# Patient Record
Sex: Female | Born: 1980
Health system: Southern US, Community
[De-identification: ages and names within clinical notes are randomized; demographics above are authoritative.]

## PROBLEM LIST (undated history)

## (undated) DIAGNOSIS — G43909 Migraine, unspecified, not intractable, without status migrainosus: Secondary | ICD-10-CM

## (undated) DIAGNOSIS — R87619 Unspecified abnormal cytological findings in specimens from cervix uteri: Secondary | ICD-10-CM

## (undated) DIAGNOSIS — E559 Vitamin D deficiency, unspecified: Secondary | ICD-10-CM

## (undated) HISTORY — DX: Vitamin D deficiency, unspecified: E55.9

## (undated) HISTORY — DX: Unspecified abnormal cytological findings in specimens from cervix uteri: R87.619

## (undated) HISTORY — PX: OTHER SURGICAL HISTORY: SHX169

## (undated) HISTORY — DX: Migraine, unspecified, not intractable, without status migrainosus: G43.909

---

## 2013-11-17 ENCOUNTER — Emergency Department: Payer: Self-pay | Admitting: Emergency Medicine

## 2018-01-03 DIAGNOSIS — R87619 Unspecified abnormal cytological findings in specimens from cervix uteri: Secondary | ICD-10-CM

## 2018-01-03 HISTORY — DX: Unspecified abnormal cytological findings in specimens from cervix uteri: R87.619

## 2018-01-08 ENCOUNTER — Encounter: Payer: Self-pay | Admitting: Family Medicine

## 2018-01-08 ENCOUNTER — Other Ambulatory Visit (HOSPITAL_COMMUNITY)
Admission: RE | Admit: 2018-01-08 | Discharge: 2018-01-08 | Disposition: A | Discharge status: Home / Self Care | Payer: BLUE CROSS/BLUE SHIELD | Source: Ambulatory Visit | Attending: Family Medicine | Admitting: Family Medicine

## 2018-01-08 ENCOUNTER — Ambulatory Visit (INDEPENDENT_AMBULATORY_CARE_PROVIDER_SITE_OTHER): Payer: BLUE CROSS/BLUE SHIELD | Admitting: Family Medicine

## 2018-01-08 VITALS — BP 118/78 | HR 92 | Temp 98.1°F | Ht 63.0 in | Wt 184.8 lb

## 2018-01-08 DIAGNOSIS — Z6832 Body mass index (BMI) 32.0-32.9, adult: Secondary | ICD-10-CM

## 2018-01-08 DIAGNOSIS — Z124 Encounter for screening for malignant neoplasm of cervix: Secondary | ICD-10-CM | POA: Insufficient documentation

## 2018-01-08 DIAGNOSIS — G43909 Migraine, unspecified, not intractable, without status migrainosus: Secondary | ICD-10-CM

## 2018-01-08 DIAGNOSIS — Z0000 Encounter for general adult medical examination without abnormal findings: Secondary | ICD-10-CM | POA: Diagnosis not present

## 2018-01-08 DIAGNOSIS — Z23 Encounter for immunization: Secondary | ICD-10-CM | POA: Diagnosis not present

## 2018-01-08 MED ORDER — AMITRIPTYLINE HCL 25 MG PO TABS: 25.0000 mg | ORAL_TABLET | Freq: Every day | ORAL | 3 refills | Status: DC

## 2018-01-08 NOTE — Patient Instructions (Addendum)
It was a pleasure to meet you today! I look forward to partnering with you for your health care needs   Please follow up in 6 months to see how your migraines are doing, sooner if needed   Health Maintenance, Female Adopting a healthy lifestyle and getting preventive care can go a long way to promote health and wellness. Talk with your health care provider about what schedule of regular examinations is right for you. This is a good chance for you to check in with your provider about disease prevention and staying healthy. In between checkups, there are plenty of things you can do on your own. Experts have done a lot of research about which lifestyle changes and preventive measures are most likely to keep you healthy. Ask your health care provider for more information. Weight and diet Eat a healthy diet  Be sure to include plenty of vegetables, fruits, low-fat dairy products, and lean protein.  Do not eat a lot of foods high in solid fats, added sugars, or salt.  Get regular exercise. This is one of the most important things you can do for your health. ? Most adults should exercise for at least 150 minutes each week. The exercise should increase your heart rate and make you sweat (moderate-intensity exercise). ? Most adults should also do strengthening exercises at least twice a week. This is in addition to the moderate-intensity exercise.  Maintain a healthy weight  Body mass index (BMI) is a measurement that can be used to identify possible weight problems. It estimates body fat based on height and weight. Your health care provider can help determine your BMI and help you achieve or maintain a healthy weight.  For females 74 years of age and older: ? A BMI below 18.5 is considered underweight. ? A BMI of 18.5 to 24.9 is normal. ? A BMI of 25 to 29.9 is considered overweight. ? A BMI of 30 and above is considered obese.  Watch levels of cholesterol and blood lipids  You should start  having your blood tested for lipids and cholesterol at 37 years of age, then have this test every 5 years.  You may need to have your cholesterol levels checked more often if: ? Your lipid or cholesterol levels are high. ? You are older than 37 years of age. ? You are at high risk for heart disease.  Cancer screening Lung Cancer  Lung cancer screening is recommended for adults 41-74 years old who are at high risk for lung cancer because of a history of smoking.  A yearly low-dose CT scan of the lungs is recommended for people who: ? Currently smoke. ? Have quit within the past 15 years. ? Have at least a 30-pack-year history of smoking. A pack year is smoking an average of one pack of cigarettes a day for 1 year.  Yearly screening should continue until it has been 15 years since you quit.  Yearly screening should stop if you develop a health problem that would prevent you from having lung cancer treatment.  Breast Cancer  Practice breast self-awareness. This means understanding how your breasts normally appear and feel.  It also means doing regular breast self-exams. Let your health care provider know about any changes, no matter how small.  If you are in your 20s or 30s, you should have a clinical breast exam (CBE) by a health care provider every 1-3 years as part of a regular health exam.  If you are 40 or older,  have a CBE every year. Also consider having a breast X-ray (mammogram) every year.  If you have a family history of breast cancer, talk to your health care provider about genetic screening.  If you are at high risk for breast cancer, talk to your health care provider about having an MRI and a mammogram every year.  Breast cancer gene (BRCA) assessment is recommended for women who have family members with BRCA-related cancers. BRCA-related cancers include: ? Breast. ? Ovarian. ? Tubal. ? Peritoneal cancers.  Results of the assessment will determine the need for  genetic counseling and BRCA1 and BRCA2 testing.  Cervical Cancer Your health care provider may recommend that you be screened regularly for cancer of the pelvic organs (ovaries, uterus, and vagina). This screening involves a pelvic examination, including checking for microscopic changes to the surface of your cervix (Pap test). You may be encouraged to have this screening done every 3 years, beginning at age 71.  For women ages 34-65, health care providers may recommend pelvic exams and Pap testing every 3 years, or they may recommend the Pap and pelvic exam, combined with testing for human papilloma virus (HPV), every 5 years. Some types of HPV increase your risk of cervical cancer. Testing for HPV may also be done on women of any age with unclear Pap test results.  Other health care providers may not recommend any screening for nonpregnant women who are considered low risk for pelvic cancer and who do not have symptoms. Ask your health care provider if a screening pelvic exam is right for you.  If you have had past treatment for cervical cancer or a condition that could lead to cancer, you need Pap tests and screening for cancer for at least 20 years after your treatment. If Pap tests have been discontinued, your risk factors (such as having a new sexual partner) need to be reassessed to determine if screening should resume. Some women have medical problems that increase the chance of getting cervical cancer. In these cases, your health care provider may recommend more frequent screening and Pap tests.  Colorectal Cancer  This type of cancer can be detected and often prevented.  Routine colorectal cancer screening usually begins at 37 years of age and continues through 37 years of age.  Your health care provider may recommend screening at an earlier age if you have risk factors for colon cancer.  Your health care provider may also recommend using home test kits to check for hidden blood in the  stool.  A small camera at the end of a tube can be used to examine your colon directly (sigmoidoscopy or colonoscopy). This is done to check for the earliest forms of colorectal cancer.  Routine screening usually begins at age 58.  Direct examination of the colon should be repeated every 5-10 years through 37 years of age. However, you may need to be screened more often if early forms of precancerous polyps or small growths are found.  Skin Cancer  Check your skin from head to toe regularly.  Tell your health care provider about any new moles or changes in moles, especially if there is a change in a mole's shape or color.  Also tell your health care provider if you have a mole that is larger than the size of a pencil eraser.  Always use sunscreen. Apply sunscreen liberally and repeatedly throughout the day.  Protect yourself by wearing long sleeves, pants, a wide-brimmed hat, and sunglasses whenever you are outside.  Heart disease, diabetes, and high blood pressure  High blood pressure causes heart disease and increases the risk of stroke. High blood pressure is more likely to develop in: ? People who have blood pressure in the high end of the normal range (130-139/85-89 mm Hg). ? People who are overweight or obese. ? People who are African American.  If you are 28-75 years of age, have your blood pressure checked every 3-5 years. If you are 61 years of age or older, have your blood pressure checked every year. You should have your blood pressure measured twice-once when you are at a hospital or clinic, and once when you are not at a hospital or clinic. Record the average of the two measurements. To check your blood pressure when you are not at a hospital or clinic, you can use: ? An automated blood pressure machine at a pharmacy. ? A home blood pressure monitor.  If you are between 45 years and 3 years old, ask your health care provider if you should take aspirin to prevent  strokes.  Have regular diabetes screenings. This involves taking a blood sample to check your fasting blood sugar level. ? If you are at a normal weight and have a low risk for diabetes, have this test once every three years after 37 years of age. ? If you are overweight and have a high risk for diabetes, consider being tested at a younger age or more often. Preventing infection Hepatitis B  If you have a higher risk for hepatitis B, you should be screened for this virus. You are considered at high risk for hepatitis B if: ? You were born in a country where hepatitis B is common. Ask your health care provider which countries are considered high risk. ? Your parents were born in a high-risk country, and you have not been immunized against hepatitis B (hepatitis B vaccine). ? You have HIV or AIDS. ? You use needles to inject street drugs. ? You live with someone who has hepatitis B. ? You have had sex with someone who has hepatitis B. ? You get hemodialysis treatment. ? You take certain medicines for conditions, including cancer, organ transplantation, and autoimmune conditions.  Hepatitis C  Blood testing is recommended for: ? Everyone born from [birth year] through 1965. ? Anyone with known risk factors for hepatitis C.  Sexually transmitted infections (STIs)  You should be screened for sexually transmitted infections (STIs) including gonorrhea and chlamydia if: ? You are sexually active and are younger than 37 years of age. ? You are older than 37 years of age and your health care provider tells you that you are at risk for this type of infection. ? Your sexual activity has changed since you were last screened and you are at an increased risk for chlamydia or gonorrhea. Ask your health care provider if you are at risk.  If you do not have HIV, but are at risk, it may be recommended that you take a prescription medicine daily to prevent HIV infection. This is called pre-exposure prophylaxis  (PrEP). You are considered at risk if: ? You are sexually active and do not regularly use condoms or know the HIV status of your partner(s). ? You take drugs by injection. ? You are sexually active with a partner who has HIV.  Talk with your health care provider about whether you are at high risk of being infected with HIV. If you choose to begin PrEP, you should first be tested for HIV.  You should then be tested every 3 months for as long as you are taking PrEP. Pregnancy  If you are premenopausal and you may become pregnant, ask your health care provider about preconception counseling.  If you may become pregnant, take 400 to 800 micrograms (mcg) of folic acid every day.  If you want to prevent pregnancy, talk to your health care provider about birth control (contraception). Osteoporosis and menopause  Osteoporosis is a disease in which the bones lose minerals and strength with aging. This can result in serious bone fractures. Your risk for osteoporosis can be identified using a bone density scan.  If you are 76 years of age or older, or if you are at risk for osteoporosis and fractures, ask your health care provider if you should be screened.  Ask your health care provider whether you should take a calcium or vitamin D supplement to lower your risk for osteoporosis.  Menopause may have certain physical symptoms and risks.  Hormone replacement therapy may reduce some of these symptoms and risks. Talk to your health care provider about whether hormone replacement therapy is right for you. Follow these instructions at home:  Schedule regular health, dental, and eye exams.  Stay current with your immunizations.  Do not use any tobacco products including cigarettes, chewing tobacco, or electronic cigarettes.  If you are pregnant, do not drink alcohol.  If you are breastfeeding, limit how much and how often you drink alcohol.  Limit alcohol intake to no more than 1 drink per day for  nonpregnant women. One drink equals 12 ounces of beer, 5 ounces of wine, or 1 ounces of hard liquor.  Do not use street drugs.  Do not share needles.  Ask your health care provider for help if you need support or information about quitting drugs.  Tell your health care provider if you often feel depressed.  Tell your health care provider if you have ever been abused or do not feel safe at home. This information is not intended to replace advice given to you by your health care provider. Make sure you discuss any questions you have with your health care provider. Document Released: 08/05/2010 Document Revised: 06/28/2015 Document Reviewed: 10/24/2014 Elsevier Interactive Patient Education  2018 [employer].   Migraine Headache A migraine headache is a very strong throbbing pain on one side or both sides of your head. Migraines can also cause other symptoms. Talk with your doctor about what things may bring on (trigger) your migraine headaches. Follow these instructions at home: Medicines  Take over-the-counter and prescription medicines only as told by your doctor.  Do not drive or use heavy machinery while taking prescription pain medicine.  To prevent or treat constipation while you are taking prescription pain medicine, your doctor may recommend that you: ? Drink enough fluid to keep your pee (urine) clear or pale yellow. ? Take over-the-counter or prescription medicines. ? Eat foods that are high in fiber. These include fresh fruits and vegetables, whole grains, and beans. ? Limit foods that are high in fat and processed sugars. These include fried and sweet foods. Lifestyle  Avoid alcohol.  Do not use any products that contain nicotine or tobacco, such as cigarettes and e-cigarettes. If you need help quitting, ask your doctor.  Get at least 8 hours of sleep every night.  Limit your stress. General instructions   Keep a journal to find out what may bring on your  migraines. For example, write down: ? What you eat  and drink. ? How much sleep you get. ? Any change in what you eat or drink. ? Any change in your medicines.  If you have a migraine: ? Avoid things that make your symptoms worse, such as bright lights. ? It may help to lie down in a dark, quiet room. ? Do not drive or use heavy machinery. ? Ask your doctor what activities are safe for you.  Keep all follow-up visits as told by your doctor. This is important. Contact a doctor if:  You get a migraine that is different or worse than your usual migraines. Get help right away if:  Your migraine gets very bad.  You have a fever.  You have a stiff neck.  You have trouble seeing.  Your muscles feel weak or like you cannot control them.  You start to lose your balance a lot.  You start to have trouble walking.  You pass out (faint). This information is not intended to replace advice given to you by your health care provider. Make sure you discuss any questions you have with your health care provider. Document Released: 10/30/2007 Document Revised: 08/10/2015 Document Reviewed: 07/09/2015 Elsevier Interactive Patient Education  2018 [employer].

## 2018-01-08 NOTE — Progress Notes (Signed)
Subjective:    Patient ID: Becky Gilmore, female    DOB: April 16, 1980, 37 y.o.   MRN: [id number]  HPI This is a 37 yo female who presents today to establish care. She lives with her 37 yo son and her mother.  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 Enjoys shopping. Increased stress, son with autism.    Last CPE- usure Pap-a couple of years ago, currently in sexual relationship, uses condoms, declines STD testing Tdap- 12/2016 Flu- today Eye- last year Dental- overdue Exercise- not regular  History reviewed. No pertinent past medical history. Past Surgical History:  Procedure Laterality Date  . CESAREAN SECTION    . genital warts     Family History  Problem Relation Age of Onset  . Diabetes Mother    Social History   Tobacco Use  . Smoking status: Former Games developermoker  . Smokeless tobacco: Never Used  Substance Use Topics  . Alcohol use: Yes    Comment: occ  . Drug use: Never        Review of Systems  Constitutional: Negative.   HENT: Negative.   Eyes: Negative.   Respiratory: Negative.   Cardiovascular: Positive for chest pain (occasionally with anxiety, has had worked up in past, negative work up. Fewer episodes than in past. Tries to relax. ). Negative for leg swelling.  Gastrointestinal: Negative.   Endocrine: Negative.   Genitourinary: Negative.   Musculoskeletal: Negative.   Allergic/Immunologic: Negative.   Neurological: Positive for headaches (history of migraines x several years, 2x/ week, light and sound sensitivity, relieved with Excedrin Migraine., sleep. ).  Hematological: Negative.   Psychiatric/Behavioral: Positive for sleep disturbance (sleeps about 6 hours a night).       Objective:   Physical Exam Physical Exam  Constitutional: She is oriented to person, place, and time. She appears well-developed and well-nourished. No distress.  HENT:  Head: Normocephalic and atraumatic.  Right Ear: External ear normal.  Left Ear: External ear  normal.  Nose: Nose normal.  Mouth/Throat: Oropharynx is clear and moist. No oropharyngeal exudate.  Eyes: Conjunctivae are normal. Pupils are equal, round, and reactive to light.  Neck: Normal range of motion. Neck supple. No JVD present. No thyromegaly present.  Cardiovascular: Normal rate, regular rhythm, normal heart sounds and intact distal pulses.   Pulmonary/Chest: Effort normal and breath sounds normal. Right breast exhibits no inverted nipple, no mass, no nipple discharge, no skin change and no tenderness. Left breast exhibits no inverted nipple, no mass, no nipple discharge, no skin change and no tenderness. Breasts are symmetrical.  Abdominal: Soft. Bowel sounds are normal. She exhibits no distension and no mass. There is no tenderness. There is no rebound and no guarding.  Genitourinary: Vagina normal. Pelvic exam was performed with patient supine. There is no rash, tenderness, lesion or injury on the right labia. There is no rash, tenderness, lesion or injury on the left labia. Cervix exhibits no motion tenderness and no discharge. Thin white vaginal discharge.  Musculoskeletal: Normal range of motion. She exhibits no edema or tenderness.  Lymphadenopathy:    She has no cervical adenopathy.  Neurological: She is alert and oriented to person, place, and time. She has normal reflexes.  Skin: Skin is warm and dry. She is not diaphoretic.  Psychiatric: She has a normal mood and affect. Her behavior is normal. Judgment and thought content normal.  Vitals reviewed.    BP 118/78 (BP Location: Right Arm, Patient Position: Sitting, Cuff Size: Normal)  Pulse 92   Temp 98.1 F (36.7 C) (Oral)   Ht 5\' 3"  (1.6 m)   Wt 184 lb 12.8 oz (83.8 kg)   LMP 12/17/2017   SpO2 99%   BMI 32.74 kg/m   Depression screen PHQ 2/9 01/08/2018  Decreased Interest 0  Down, Depressed, Hopeless 0  PHQ - 2 Score 0       Assessment & Plan:  1. Annual physical exam - Discussed and encouraged healthy  lifestyle choices- adequate sleep, regular exercise, stress management and healthy food choices.    2. Need for influenza vaccination - Flu Vaccine QUAD 36+ mos IM  3. Screening for cervical cancer - PAP [[facility]]  4. BMI 32.0-32.9,adult - CBC with Differential/Platelet; Future - Comprehensive metabolic panel; Future - TSH; Future - Hemoglobin A1c; Future - VITAMIN D 25 Hydroxy (Vit-D Deficiency, Fractures); Future - Lipid panel; Future  5. Migraine without status migrainosus, not intractable, unspecified migraine type - Provided written and verbal information regarding diagnosis and treatment. - amitriptyline (ELAVIL) 25 MG tablet; Take 1 tablet (25 mg total) by mouth at bedtime.  Dispense: 90 tablet; Refill: 3 - CBC with Differential/Platelet; Future - follow up in 6 months  Olean Ree, FNP-BC  [facility], [facility]  01/08/2018 4:45 PM

## 2018-01-11 LAB — CYTOLOGY - PAP
Bacterial vaginitis: NEGATIVE
Candida vaginitis: NEGATIVE
Chlamydia: NEGATIVE
Diagnosis: NEGATIVE
HPV 16/18/45 genotyping: POSITIVE — AB
HPV: DETECTED — AB
NEISSERIA GONORRHEA: NEGATIVE
TRICH (WINDOWPATH): NEGATIVE

## 2018-01-12 ENCOUNTER — Encounter: Payer: Self-pay | Admitting: Family Medicine

## 2018-02-01 ENCOUNTER — Other Ambulatory Visit (INDEPENDENT_AMBULATORY_CARE_PROVIDER_SITE_OTHER): Payer: BLUE CROSS/BLUE SHIELD

## 2018-02-01 DIAGNOSIS — G43909 Migraine, unspecified, not intractable, without status migrainosus: Secondary | ICD-10-CM

## 2018-02-01 DIAGNOSIS — Z6832 Body mass index (BMI) 32.0-32.9, adult: Secondary | ICD-10-CM

## 2018-02-01 LAB — COMPREHENSIVE METABOLIC PANEL WITH GFR
ALT: 13 U/L (ref 0–35)
AST: 15 U/L (ref 0–37)
Albumin: 3.9 g/dL (ref 3.5–5.2)
Alkaline Phosphatase: 72 U/L (ref 39–117)
BUN: 11 mg/dL (ref 6–23)
CO2: 27 meq/L (ref 19–32)
Calcium: 9 mg/dL (ref 8.4–10.5)
Chloride: 102 meq/L (ref 96–112)
Creatinine, Ser: 0.87 mg/dL (ref 0.40–1.20)
GFR: 94.17 mL/min
Glucose, Bld: 90 mg/dL (ref 70–99)
Potassium: 4.4 meq/L (ref 3.5–5.1)
Sodium: 135 meq/L (ref 135–145)
Total Bilirubin: 0.4 mg/dL (ref 0.2–1.2)
Total Protein: 6.9 g/dL (ref 6.0–8.3)

## 2018-02-01 LAB — CBC WITH DIFFERENTIAL/PLATELET
Basophils Absolute: 0 K/uL (ref 0.0–0.1)
Basophils Relative: 0.5 % (ref 0.0–3.0)
Eosinophils Absolute: 0.1 K/uL (ref 0.0–0.7)
Eosinophils Relative: 1.2 % (ref 0.0–5.0)
HCT: 39.5 % (ref 36.0–46.0)
Hemoglobin: 12.5 g/dL (ref 12.0–15.0)
Lymphocytes Relative: 27 % (ref 12.0–46.0)
Lymphs Abs: 2.3 K/uL (ref 0.7–4.0)
MCHC: 31.6 g/dL (ref 30.0–36.0)
MCV: 80.6 fl (ref 78.0–100.0)
Monocytes Absolute: 0.5 K/uL (ref 0.1–1.0)
Monocytes Relative: 5.9 % (ref 3.0–12.0)
Neutro Abs: 5.5 K/uL (ref 1.4–7.7)
Neutrophils Relative %: 65.4 % (ref 43.0–77.0)
Platelets: 347 K/uL (ref 150.0–400.0)
RBC: 4.9 Mil/uL (ref 3.87–5.11)
RDW: 15.7 % — ABNORMAL HIGH (ref 11.5–15.5)
WBC: 8.5 K/uL (ref 4.0–10.5)

## 2018-02-01 LAB — HEMOGLOBIN A1C: Hgb A1c MFr Bld: 5.4 % (ref 4.6–6.5)

## 2018-02-01 LAB — VITAMIN D 25 HYDROXY (VIT D DEFICIENCY, FRACTURES): VITD: 24.3 ng/mL — ABNORMAL LOW (ref 30.00–100.00)

## 2018-02-01 LAB — TSH: TSH: 1.94 u[IU]/mL (ref 0.35–4.50)

## 2018-02-01 LAB — LIPID PANEL
Cholesterol: 216 mg/dL — ABNORMAL HIGH (ref 0–200)
HDL: 56.9 mg/dL
LDL Cholesterol: 139 mg/dL — ABNORMAL HIGH (ref 0–99)
NonHDL: 158.92
Total CHOL/HDL Ratio: 4
Triglycerides: 98 mg/dL (ref 0.0–149.0)
VLDL: 19.6 mg/dL (ref 0.0–40.0)

## 2018-07-09 ENCOUNTER — Ambulatory Visit: Payer: BLUE CROSS/BLUE SHIELD | Admitting: Family Medicine

## 2019-01-04 ENCOUNTER — Encounter: Payer: Self-pay | Admitting: Family Medicine

## 2019-01-04 ENCOUNTER — Ambulatory Visit (INDEPENDENT_AMBULATORY_CARE_PROVIDER_SITE_OTHER): Payer: BC Managed Care – PPO | Admitting: Family Medicine

## 2019-01-04 DIAGNOSIS — R350 Frequency of micturition: Secondary | ICD-10-CM | POA: Diagnosis not present

## 2019-01-04 DIAGNOSIS — M545 Low back pain, unspecified: Secondary | ICD-10-CM | POA: Insufficient documentation

## 2019-01-04 DIAGNOSIS — R519 Headache, unspecified: Secondary | ICD-10-CM | POA: Insufficient documentation

## 2019-01-04 DIAGNOSIS — R109 Unspecified abdominal pain: Secondary | ICD-10-CM | POA: Insufficient documentation

## 2019-01-04 DIAGNOSIS — G44209 Tension-type headache, unspecified, not intractable: Secondary | ICD-10-CM | POA: Diagnosis not present

## 2019-01-04 DIAGNOSIS — R1013 Epigastric pain: Secondary | ICD-10-CM | POA: Diagnosis not present

## 2019-01-04 NOTE — Progress Notes (Signed)
I connected with Becky Gilmore on 01/04/19 at 10:00 AM EST by video and verified that I am speaking with the correct person using two identifiers.   I discussed the limitations, risks, security and privacy concerns of performing an evaluation and management service by video and the availability of in person appointments. I also discussed with the patient that there may be a patient responsible charge related to this service. The patient expressed understanding and agreed to proceed.  Patient location: Home Provider Location: [place] Participants: Becky Gilmore and Becky Gilmore   Subjective:     Becky Gilmore is a 38 y.o. 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  HENT: Positive for rhinorrhea. Negative for sinus pressure and sinus pain.   Respiratory: Negative for cough and shortness of breath.   Gastrointestinal:  Positive for abdominal pain (upper abdomen). Negative for constipation, diarrhea, nausea and vomiting.  Genitourinary: Positive for frequency. Negative for difficulty urinating and dysuria.  Musculoskeletal: Negative for arthralgias and myalgias.  Neurological: Positive for headaches. Negative for numbness.     Social History   Tobacco Use  Smoking Status Former Smoker  Smokeless Tobacco Never Used        Objective:   BP Readings from Last 3 Encounters:  01/08/18 118/78   Wt Readings from Last 3 Encounters:  01/04/19 180 lb (81.6 kg)  01/08/18 184 lb 12.8 oz (83.8 kg)   Wt 180 lb (81.6 kg)   LMP 12/28/2018   BMI 31.89 kg/m   Physical Exam Constitutional:      Appearance: Normal appearance. She is not ill-appearing.  HENT:     Head: Normocephalic and atraumatic.     Right Ear: External ear normal.     Left Ear: External ear normal.  Eyes:     General: No scleral icterus.    Extraocular Movements: Extraocular movements intact.     Conjunctiva/sclera: Conjunctivae normal.  Neck:     Musculoskeletal: Normal range of motion.  Pulmonary:     Effort: Pulmonary effort is normal. No respiratory distress.  Musculoskeletal:     Comments: Walking around her house without difficulty. Getting up from laying position to standing w/o difficulty.   Neurological:     Mental Status: She is alert. Mental status is at baseline.  Psychiatric:        Mood and Affect: Mood normal.        Behavior: Behavior normal.        Thought Content: Thought content normal.        Judgment: Judgment normal.          Assessment & Plan:   Problem List Items Addressed This Visit      Other   Low back pain    Intermittent low back pain worse with seated position. Advised NSAID and exercise routine. No red flags. F/u if no improvement with in-person visit.       Headache    Suspect tension HA. Encouraged hydration, reduce caffeine and NSAIDs. ER precautions discussed as exam limited.        Urinary frequency    Pt notes primarily drinking caffeine. Advised trial of reduced caffeine and more water to see if that improves. F/u if no improvement. No dysuria and sxs for >1 year. Wonder about overactive bladder.       Abdominal pain    2 days of symptoms in setting of HA and back pain w/o other localizing symptoms. Advised watch and wait and increased hydration. Return precautions discussed.           Return if symptoms worsen or fail to improve.  Lynnda Child, MD

## 2019-01-04 NOTE — Assessment & Plan Note (Signed)
Intermittent low back pain worse with seated position. Advised NSAID and exercise routine. No red flags. F/u if no improvement with in-person visit.

## 2019-01-04 NOTE — Assessment & Plan Note (Signed)
2 days of symptoms in setting of HA and back pain w/o other localizing symptoms. Advised watch and wait and increased hydration. Return precautions discussed.

## 2019-01-04 NOTE — Patient Instructions (Signed)
#Headache - Take Ibuprofen 600-800 mg  - Drink more water - if you develop fever, chills, worsening neck pain or inability to bend your neck call back  #Abdominal pain - Continue to monitor - drink water - if worsening or unable to eat or drink -- call back  #Back pain - Take Ibuprofen as above - Try heat or ice - exercises - below  #Urinary symptoms - Try to replace some of your caffeine with water - follow-up with Becky Gilmore if worsening or continuing to discuss in further detail   Low Back Sprain or Strain Rehab Ask your health care provider which exercises are safe for you. Do exercises exactly as told by your health care provider and adjust them as directed. It is normal to feel mild stretching, pulling, tightness, or discomfort as you do these exercises. Stop right away if you feel sudden pain or your pain gets worse. Do not begin these exercises until told by your health care provider. Stretching and range-of-motion exercises These exercises warm up your muscles and joints and improve the movement and flexibility of your back. These exercises also help to relieve pain, numbness, and tingling. Lumbar rotation  1. Lie on your back on a firm surface and bend your knees. 2. Straighten your arms out to your sides so each arm forms a 90-degree angle (right angle) with a side of your body. 3. Slowly move (rotate) both of your knees to one side of your body until you feel a stretch in your lower back (lumbar). Try not to let your shoulders lift off the floor. 4. Hold this position for __________ seconds. 5. Tense your abdominal muscles and slowly move your knees back to the starting position. 6. Repeat this exercise on the other side of your body. Repeat __________ times. Complete this exercise __________ times a day. Single knee to chest  1. Lie on your back on a firm surface with both legs straight. 2. Bend one of your knees. Use your hands to move your knee up toward your chest  until you feel a gentle stretch in your lower back and buttock. ? Hold your leg in this position by holding on to the front of your knee. ? Keep your other leg as straight as possible. 3. Hold this position for __________ seconds. 4. Slowly return to the starting position. 5. Repeat with your other leg. Repeat __________ times. Complete this exercise __________ times a day. Prone extension on elbows  1. Lie on your abdomen on a firm surface (prone position). 2. Prop yourself up on your elbows. 3. Use your arms to help lift your chest up until you feel a gentle stretch in your abdomen and your lower back. ? This will place some of your body weight on your elbows. If this is uncomfortable, try stacking pillows under your chest. ? Your hips should stay down, against the surface that you are lying on. Keep your hip and back muscles relaxed. 4. Hold this position for __________ seconds. 5. Slowly relax your upper body and return to the starting position. Repeat __________ times. Complete this exercise __________ times a day. Strengthening exercises These exercises build strength and endurance in your back. Endurance is the ability to use your muscles for a long time, even after they get tired. Pelvic tilt This exercise strengthens the muscles that lie deep in the abdomen. 1. Lie on your back on a firm surface. Bend your knees and keep your feet flat on the floor. 2. Tense your  abdominal muscles. Tip your pelvis up toward the ceiling and flatten your lower back into the floor. ? To help with this exercise, you may place a small towel under your lower back and try to push your back into the towel. 3. Hold this position for __________ seconds. 4. Let your muscles relax completely before you repeat this exercise. Repeat __________ times. Complete this exercise __________ times a day. Alternating arm and leg raises  1. Get on your hands and knees on a firm surface. If you are on a hard floor, you  may want to use padding, such as an exercise mat, to cushion your knees. 2. Line up your arms and legs. Your hands should be directly below your shoulders, and your knees should be directly below your hips. 3. Lift your left leg behind you. At the same time, raise your right arm and straighten it in front of you. ? Do not lift your leg higher than your hip. ? Do not lift your arm higher than your shoulder. ? Keep your abdominal and back muscles tight. ? Keep your hips facing the ground. ? Do not arch your back. ? Keep your balance carefully, and do not hold your breath. 4. Hold this position for __________ seconds. 5. Slowly return to the starting position. 6. Repeat with your right leg and your left arm. Repeat __________ times. Complete this exercise __________ times a day. Abdominal set with straight leg raise  1. Lie on your back on a firm surface. 2. Bend one of your knees and keep your other leg straight. 3. Tense your abdominal muscles and lift your straight leg up, 4-6 inches (10-15 cm) off the ground. 4. Keep your abdominal muscles tight and hold this position for __________ seconds. ? Do not hold your breath. ? Do not arch your back. Keep it flat against the ground. 5. Keep your abdominal muscles tense as you slowly lower your leg back to the starting position. 6. Repeat with your other leg. Repeat __________ times. Complete this exercise __________ times a day. Single leg lower with bent knees 1. Lie on your back on a firm surface. 2. Tense your abdominal muscles and lift your feet off the floor, one foot at a time, so your knees and hips are bent in 90-degree angles (right angles). ? Your knees should be over your hips and your lower legs should be parallel to the floor. 3. Keeping your abdominal muscles tense and your knee bent, slowly lower one of your legs so your toe touches the ground. 4. Lift your leg back up to return to the starting position. ? Do not hold your  breath. ? Do not let your back arch. Keep your back flat against the ground. 5. Repeat with your other leg. Repeat __________ times. Complete this exercise __________ times a day. Posture and body mechanics Good posture and healthy body mechanics can help to relieve stress in your body's tissues and joints. Body mechanics refers to the movements and positions of your body while you do your daily activities. Posture is part of body mechanics. Good posture means:  Your spine is in its natural S-curve position (neutral).  Your shoulders are pulled back slightly.  Your head is not tipped forward. Follow these guidelines to improve your posture and body mechanics in your everyday activities. Standing   When standing, keep your spine neutral and your feet about hip width apart. Keep a slight bend in your knees. Your ears, shoulders, and hips should line  up.  When you do a task in which you stand in one place for a long time, place one foot up on a stable object that is 2-4 inches (5-10 cm) high, such as a footstool. This helps keep your spine neutral. Sitting   When sitting, keep your spine neutral and keep your feet flat on the floor. Use a footrest, if necessary, and keep your thighs parallel to the floor. Avoid rounding your shoulders, and avoid tilting your head forward.  When working at a desk or a computer, keep your desk at a height where your hands are slightly lower than your elbows. Slide your chair under your desk so you are close enough to maintain good posture.  When working at a computer, place your monitor at a height where you are looking straight ahead and you do not have to tilt your head forward or downward to look at the screen. Resting  When lying down and resting, avoid positions that are most painful for you.  If you have pain with activities such as sitting, bending, stooping, or squatting, lie in a position in which your body does not bend very much. For example,  avoid curling up on your side with your arms and knees near your chest (fetal position).  If you have pain with activities such as standing for a long time or reaching with your arms, lie with your spine in a neutral position and bend your knees slightly. Try the following positions: ? Lying on your side with a pillow between your knees. ? Lying on your back with a pillow under your knees. Lifting   When lifting objects, keep your feet at least shoulder width apart and tighten your abdominal muscles.  Bend your knees and hips and keep your spine neutral. It is important to lift using the strength of your legs, not your back. Do not lock your knees straight out.  Always ask for help to lift heavy or awkward objects. This information is not intended to replace advice given to you by your health care provider. Make sure you discuss any questions you have with your health care provider. Document Released: 01/20/2005 Document Revised: 05/14/2018 Document Reviewed: 02/11/2018 Elsevier Patient Education  2020 [employer].

## 2019-01-04 NOTE — Assessment & Plan Note (Signed)
Pt notes primarily drinking caffeine. Advised trial of reduced caffeine and more water to see if that improves. F/u if no improvement. No dysuria and sxs for >1 year. Wonder about overactive bladder.

## 2019-01-04 NOTE — Assessment & Plan Note (Signed)
Suspect tension HA. Encouraged hydration, reduce caffeine and NSAIDs. ER precautions discussed as exam limited.

## 2019-02-04 HISTORY — PX: COLPOSCOPY: SHX161

## 2019-03-02 ENCOUNTER — Other Ambulatory Visit: Payer: BC Managed Care – PPO

## 2019-03-02 ENCOUNTER — Ambulatory Visit: Payer: BC Managed Care – PPO | Attending: Internal Medicine

## 2019-03-02 DIAGNOSIS — Z20822 Contact with and (suspected) exposure to covid-19: Secondary | ICD-10-CM | POA: Diagnosis not present

## 2019-03-03 ENCOUNTER — Telehealth: Payer: Self-pay

## 2019-03-03 LAB — NOVEL CORONAVIRUS, NAA: SARS-CoV-2, NAA: NOT DETECTED

## 2019-03-03 NOTE — Telephone Encounter (Signed)
Public health guidelines are to isolate until the results of the test are back.   She should schedule a virtual visit if she has concerns about her symptoms and should consider going to the ER if CP or breathing difficulty is worsening.   Work-note provided to [employer]

## 2019-03-03 NOTE — Telephone Encounter (Signed)
Pt left work early yesterday due to abdominal pain, chest pain, chills, shortness of breath. She was tested at [facility] yesterday.  Her work made her come in today because she did not have a fever. Pt is feeling really bad. Asking if she has to make a Virtual Visit to get a note to stay out until the results come back or her symptoms subside.  Please advise [phone]

## 2019-03-03 NOTE — Telephone Encounter (Signed)
Patient advised. Patient states she will see how she is with her symptoms and if needed will call back tomorrow

## 2019-03-04 ENCOUNTER — Other Ambulatory Visit: Payer: Self-pay

## 2019-03-04 ENCOUNTER — Ambulatory Visit (INDEPENDENT_AMBULATORY_CARE_PROVIDER_SITE_OTHER): Payer: BC Managed Care – PPO | Admitting: Internal Medicine

## 2019-03-04 VITALS — BP 120/70 | HR 92 | Temp 98.8°F | Ht 63.0 in

## 2019-03-04 DIAGNOSIS — R0602 Shortness of breath: Secondary | ICD-10-CM

## 2019-03-04 NOTE — Patient Instructions (Signed)
COVID-19: Quarantine vs. Isolation QUARANTINE keeps someone who was in close contact with someone who has COVID-19 away from others. If you had close contact with a person who has COVID-19  Stay home until 14 days after your last contact.  Check your temperature twice a day and watch for symptoms of COVID-19.  If possible, stay away from people who are at higher-risk for getting very sick from COVID-19. ISOLATION keeps someone who is sick or tested positive for COVID-19 without symptoms away from others, even in their own home. If you are sick and think or know you have COVID-19  Stay home until after ? At least 10 days since symptoms first appeared and ? At least 24 hours with no fever without fever-reducing medication and ? Symptoms have improved If you tested positive for COVID-19 but do not have symptoms  Stay home until after ? 10 days have passed since your positive test If you live with others, stay in a specific "sick room" or area and away from other people or animals, including pets. Use a separate bathroom, if available. cdc.gov/coronavirus 08/23/2018 This information is not intended to replace advice given to you by your health care provider. Make sure you discuss any questions you have with your health care provider. Document Revised: 01/06/2019 Document Reviewed: 01/06/2019 Elsevier Patient Education  2020 Elsevier Inc.  

## 2019-03-04 NOTE — Progress Notes (Signed)
Respiratory Clinic Note    Patient's initial symptoms began on 1/25 as cough, SOB, congestion, chest pain.  Covid testing was completed on 1/27; results were negative. Patient had exposure to her nieces on 1/22 and 1/23; they both subsequently tested positive after the weekend.  Current symptoms include as above.   Symptoms are stable.  Symptomatic treatment includes: none.   Significant medical comorbidities present include: PMH of MI, stroke, hypertension, chronic kidney disease, chronic liver disease, emphysema, asthma, sleep apnea, diabetes, history of cancer, dyslipidemia, obesity and smoking   Positive review of systems for Covid infection are documented above in HPI.   Physical exam:  Vitals:   03/04/19 1813  BP: 120/70  Pulse: 92  Temp: 98.8 F (37.1 C)  SpO2: 99%   General appearance: Adequately nourished; no acute distress, increased work of breathing is present.   Lymphatic: No lymphadenopathy about the head, neck, axilla. Eyes: No conjunctival inflammation or lid edema is present. There is no scleral icterus. Ears:  External ear exam shows no significant lesions or deformities.   Nose:  External nasal examination shows no deformity or inflammation. Nasal mucosa are pink and moist without lesions, exudates Oral exam:  Lips and gums are healthy appearing. There is no oropharyngeal erythema or exudate. Neck:  No thyromegaly, masses, tenderness noted.    Heart:  Normal rate and regular rhythm. S1 and S2 normal without gallop, murmur, click, rub  Lungs: Chest clear to auscultation without wheezes, rhonchi, rales, rubs. Abdomen: Bowel sounds are normal. Abdomen is soft and nontender with no organomegaly, hernias, masses. GU: Deferred  Extremities:  No cyanosis, clubbing, edema  Neurologic exam :Grossly intact. Normal gait.  Skin: Warm & dry w/o tenting. No significant lesions or rash.   1. Shortness of breath Concern that patient was tested too early for COVID-19.  Symptoms sound consistent with the virus and she has two known recent exposures. Will retest this evening. Discussed continuing quarantining. Low suspicion for PE as patient is not hypoxic or tachycardic. She does not have any signs of DVT on exam either. Lungs are clear so doubt secondary pneumonia. Will write out of work and order home monitoring program. Discussed symptomatic care. Also advised to seek care at urgent care or ER if worsens over the weekend.  [facility] COVID-19 HOME MONITORING PROGRAM - Temperature monitoring; Future   Marcy Siren, D.O. 03/04/2019, 6:22 PM

## 2019-03-04 NOTE — Telephone Encounter (Signed)
Spoke to pt. Made appt at [facility] tonight.

## 2019-03-04 NOTE — Telephone Encounter (Signed)
Please call patient and let her know that I agree that she needs to be evaluate but due to her symptoms and exposure, I can not bring her into the office. Please see if she is willing to be schedule at the respiratory clinic? The other option would be urgent care.

## 2019-03-04 NOTE — Addendum Note (Signed)
Addended by: Cydney Ok on: 03/04/2019 06:55 PM   Modules accepted: Orders

## 2019-03-04 NOTE — Telephone Encounter (Signed)
Pt called back still having chest pain and shortness of breath. Chills are coming and going. Tested 03-02-19. She was exposed one day last week. Concerned with the chest pain and shortness of breath. Wants to be seen. Please advise [phone].

## 2019-03-05 ENCOUNTER — Encounter (INDEPENDENT_AMBULATORY_CARE_PROVIDER_SITE_OTHER): Payer: Self-pay

## 2019-03-05 LAB — NOVEL CORONAVIRUS, NAA: SARS-CoV-2, NAA: NOT DETECTED

## 2019-03-07 ENCOUNTER — Encounter (INDEPENDENT_AMBULATORY_CARE_PROVIDER_SITE_OTHER): Payer: Self-pay

## 2019-03-09 ENCOUNTER — Encounter (INDEPENDENT_AMBULATORY_CARE_PROVIDER_SITE_OTHER): Payer: Self-pay

## 2019-03-09 ENCOUNTER — Telehealth: Payer: Self-pay

## 2019-03-09 NOTE — Telephone Encounter (Signed)
Tried to contact pt, no answer, left VM

## 2019-03-09 NOTE — Telephone Encounter (Signed)
Outgoing call to Patient no answer.  Left message on questionnaire as to what medication she may take for diarrhea.  Encouraged Patient to drink plenty fluids to prevent dehydration.  Encouraged Pt to call if there are more than seven stools. Out Pt was called in response to new Sx from mychart   covid Sx. Monitoring questionnaire

## 2019-05-11 ENCOUNTER — Ambulatory Visit (INDEPENDENT_AMBULATORY_CARE_PROVIDER_SITE_OTHER): Payer: BC Managed Care – PPO | Admitting: Family Medicine

## 2019-05-11 ENCOUNTER — Other Ambulatory Visit (HOSPITAL_COMMUNITY)
Admission: RE | Admit: 2019-05-11 | Discharge: 2019-05-11 | Disposition: A | Discharge status: Home / Self Care | Payer: BC Managed Care – PPO | Source: Ambulatory Visit | Attending: Family Medicine | Admitting: Family Medicine

## 2019-05-11 ENCOUNTER — Telehealth: Payer: Self-pay | Admitting: Family Medicine

## 2019-05-11 ENCOUNTER — Encounter: Payer: Self-pay | Admitting: Family Medicine

## 2019-05-11 ENCOUNTER — Other Ambulatory Visit: Payer: Self-pay

## 2019-05-11 VITALS — BP 122/72 | HR 97 | Temp 98.2°F | Ht 63.0 in | Wt 191.0 lb

## 2019-05-11 DIAGNOSIS — R350 Frequency of micturition: Secondary | ICD-10-CM

## 2019-05-11 DIAGNOSIS — R8789 Other abnormal findings in specimens from female genital organs: Secondary | ICD-10-CM

## 2019-05-11 DIAGNOSIS — Z6832 Body mass index (BMI) 32.0-32.9, adult: Secondary | ICD-10-CM | POA: Diagnosis not present

## 2019-05-11 DIAGNOSIS — R87618 Other abnormal cytological findings on specimens from cervix uteri: Secondary | ICD-10-CM

## 2019-05-11 DIAGNOSIS — Z0000 Encounter for general adult medical examination without abnormal findings: Secondary | ICD-10-CM

## 2019-05-11 DIAGNOSIS — G44229 Chronic tension-type headache, not intractable: Secondary | ICD-10-CM

## 2019-05-11 DIAGNOSIS — Z124 Encounter for screening for malignant neoplasm of cervix: Secondary | ICD-10-CM | POA: Diagnosis not present

## 2019-05-11 LAB — POC URINALSYSI DIPSTICK (AUTOMATED)
Bilirubin, UA: NEGATIVE
Blood, UA: NEGATIVE
Glucose, UA: NEGATIVE
Ketones, UA: NEGATIVE
Leukocytes, UA: NEGATIVE
Nitrite, UA: NEGATIVE
Protein, UA: POSITIVE — AB
Spec Grav, UA: >=1.03 — AB (ref 1.010–1.025)
Urobilinogen, UA: 0.2 E.U./dL
pH, UA: 6 (ref 5.0–8.0)

## 2019-05-11 LAB — HEMOGLOBIN A1C: Hgb A1c MFr Bld: 5.5 % (ref 4.6–6.5)

## 2019-05-11 LAB — LIPID PANEL
Cholesterol: 230 mg/dL — ABNORMAL HIGH (ref 0–200)
HDL: 53.9 mg/dL (ref >39.00)
LDL Cholesterol: 152 mg/dL — ABNORMAL HIGH (ref 0–99)
NonHDL: 176.11
Total CHOL/HDL Ratio: 4
Triglycerides: 120 mg/dL (ref 0.0–149.0)
VLDL: 24 mg/dL (ref 0.0–40.0)

## 2019-05-11 LAB — VITAMIN D 25 HYDROXY (VIT D DEFICIENCY, FRACTURES): VITD: 23 ng/mL — ABNORMAL LOW (ref 30.00–100.00)

## 2019-05-11 NOTE — Telephone Encounter (Signed)
Please call patient and ask her if she minds doing a virtual visit so I can do screening for anxiety and depression as well ad make sure I have appropriate documentation for leave. I suggest she talk to her HR department about what is required for her to have a leave.

## 2019-05-11 NOTE — Progress Notes (Signed)
Subjective:    Patient ID: Becky Gilmore, female    DOB: 02-28-1980, 39 y.o.   MRN: [id number]  HPI Chief Complaint  Patient presents with  . Annual Exam    Flu-2019 ... TD--2018... Pap--01/2018... Vision--annually... Dentist--biannually....   This is a 39 yo female who presents today for cpe. Lives with her son (33 yo, autistic) and mom (part time). 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 Work is stressful.     Last CPE- 2019 Pap- 2019- abnormal, did not return for 1 year follow up.  Tdap- 05/12/2016 Flu- some years Eye- regular Exercise-none  Diet- eats out a lot. Drinks Ensure or Boost while working. Drinks soda, juice.  Had tacos from taco truck this morning on way to appointment.  Headaches- come and goes. Up to 3x/ week, not every week. All over head. Goes away with Excedrin. Poor hydration.  Notices that fast foods seem to trigger.  She has fewer headaches when she is eating home-cooked meals.   Review of Systems  Constitutional: Negative.   HENT: Negative.   Eyes: Negative.   Respiratory: Negative.   Cardiovascular: Negative.   Gastrointestinal: Negative.   Genitourinary: Positive for frequency. Negative for dysuria, flank pain and menstrual problem.  Musculoskeletal: Negative.   Skin: Negative.   Allergic/Immunologic: Negative.   Neurological: Positive for headaches.  Hematological: Negative.   Psychiatric/Behavioral: Positive for sleep disturbance (difficulty falling asleep). Negative for dysphoric mood.       Objective:   Physical Exam Vitals reviewed. Exam conducted with a chaperone present Shawna Orleans, CMA).  Constitutional:      General: She is not in acute distress.    Appearance: Normal appearance. She is obese. She is not ill-appearing, toxic-appearing or diaphoretic.  HENT:     Head: Atraumatic.  Eyes:     Conjunctiva/sclera: Conjunctivae normal.  Cardiovascular:     Rate and Rhythm: Normal rate and regular rhythm.     Heart sounds: Normal heart  sounds.  Pulmonary:     Effort: Pulmonary effort is normal.     Breath sounds: Normal breath sounds.  Chest:     Breasts:        Right: Normal.        Left: Normal.  Abdominal:     General: Abdomen is flat. Bowel sounds are normal. There is no distension.     Palpations: There is no mass.     Tenderness: There is no abdominal tenderness. There is no guarding or rebound.     Hernia: No hernia is present.  Musculoskeletal:     Cervical back: Normal range of motion and neck supple. No rigidity or tenderness.     Right lower leg: No edema.     Left lower leg: No edema.  Lymphadenopathy:     Cervical: No cervical adenopathy.     Upper Body:     Right upper body: No supraclavicular, axillary or pectoral adenopathy.     Left upper body: No supraclavicular, axillary or pectoral adenopathy.  Skin:    General: Skin is warm and dry.  Neurological:     Mental Status: She is alert and oriented to person, place, and time.  Psychiatric:        Mood and Affect: Mood normal.        Behavior: Behavior normal.        Thought Content: Thought content normal.        Judgment: Judgment normal.       BP 122/72  Pulse 97   Temp 98.2 F (36.8 C) (Temporal)   Ht 5\' 3"  (1.6 m)   Wt 191 lb (86.6 kg)   SpO2 99%   BMI 33.83 kg/m  Wt Readings from Last 3 Encounters:  05/11/19 191 lb (86.6 kg)  01/04/19 180 lb (81.6 kg)  01/08/18 184 lb 12.8 oz (83.8 kg)   Depression screen [facility] 2/9 05/11/2019 01/08/2018  Decreased Interest 0 0  Down, Depressed, Hopeless 0 0  PHQ - 2 Score 0 0  Altered sleeping 0 -  Tired, decreased energy 0 -  Change in appetite 0 -  Feeling bad or failure about yourself  0 -  Trouble concentrating 0 -  Moving slowly or fidgety/restless 0 -  Suicidal thoughts 0 -  PHQ-9 Score 0 -  Difficult doing work/chores Not difficult at all -       Assessment & Plan:  1. Annual physical exam - Discussed and encouraged healthy lifestyle choices- adequate sleep, regular exercise,  stress management and healthy food choices.    2. Urinary frequency - negative urinalysis, encouraged her to increase her water intake and decrease intake of soda and juice - POCT Urinalysis Dipstick (Automated)  3. BMI 32.0-32.9,adult -Provided written information regarding balanced meal planning - Lipid panel - Vitamin D, 25-hydroxy - Hemoglobin A1c  4. Screening for cervical cancer - Cytology - PAP([facility])  5. Chronic tension-type headache, not intractable -Discussed possible triggers and encouraged her to keep a headache log as well as increase her water intake and avoid known triggers including fast food and processed food  -Follow-up in 1 year  This visit occurred during the SARS-CoV-2 public health emergency.  Safety protocols were in place, including screening questions prior to the visit, additional usage of staff PPE, and extensive cleaning of exam room while observing appropriate contact time as indicated for disinfecting solutions.      Clarene Reamer, FNP-BC  [facility], [facility]  05/11/2019 5:39 PM

## 2019-05-11 NOTE — Patient Instructions (Addendum)
Good to see you today  Increase water intake, avoid fast and processed foods- see if this helps your headaches.   Work on meal planning so you don't have to rely on fast food/ processed foods.   Try to walk every day for 20-30 minutes to see if improved sleep.   A resource that I like is www.dietdoctor.com/diabetes/diet  Here are some guidelines to help you with meal planning -  Avoid all processed and packaged foods (bread, pasta, crackers, chips, etc) and beverages containing calories.  Avoid added sugars and excessive natural sugars.  Attention to how you feel if you consume artificial sweeteners.  Do they make you more hungry or raise your blood sugar?  With every meal and snack, aim to get 20 g of protein (3 ounces of meat, 4 ounces of fish, 3 eggs, protein powder, 1 cup [place] yogurt, 1 cup cottage cheese, etc.)  Increase fiber in the form of non-starchy vegetables.  These help you feel full with very little carbohydrates and are good for gut health.  Eat 1 serving healthy carb per meal- 1/2 cup brown rice, beans, potato, corn- pay attention to whether or not this significantly raises your blood sugar. If it does, reduce the frequency you consume these.   Eat 2-3 servings of lower sugar fruits daily.  This includes berries, apples, oranges, peaches, pears, one half banana.  Have small amounts of good fats such as avocado, nuts, olive oil, nut butters, olives.  Add a little cheese to your salads to make them tasty.

## 2019-05-11 NOTE — Telephone Encounter (Signed)
Pt called in because she forgot to discuss something at her physical this morning. She is wondering if she can be written out of work for the next week or two because she needs a break. I advised her she may have to get FMLA ppw from work but I would send the message back first to see what you advise.

## 2019-05-12 ENCOUNTER — Encounter: Payer: Self-pay | Admitting: Family Medicine

## 2019-05-12 LAB — CYTOLOGY - PAP
Adequacy: ABSENT
Comment: NEGATIVE
Diagnosis: NEGATIVE
High risk HPV: POSITIVE — AB

## 2019-05-12 NOTE — Telephone Encounter (Signed)
Noted  

## 2019-05-12 NOTE — Telephone Encounter (Signed)
Patient scheduled with Debbie 05/13/19 at 11:45 DOXY   Nothing further needed.

## 2019-05-12 NOTE — Telephone Encounter (Signed)
Patient scheduled with Debbie 05/13/19 at 11:45 DOXY - see TE 05/11/19  Nothing further needed.

## 2019-05-13 ENCOUNTER — Encounter: Payer: Self-pay | Admitting: Family Medicine

## 2019-05-13 ENCOUNTER — Ambulatory Visit (INDEPENDENT_AMBULATORY_CARE_PROVIDER_SITE_OTHER): Payer: BC Managed Care – PPO | Admitting: Family Medicine

## 2019-05-13 VITALS — Ht 63.0 in | Wt 190.0 lb

## 2019-05-13 DIAGNOSIS — F5104 Psychophysiologic insomnia: Secondary | ICD-10-CM

## 2019-05-13 DIAGNOSIS — F419 Anxiety disorder, unspecified: Secondary | ICD-10-CM

## 2019-05-13 NOTE — Progress Notes (Signed)
Virtual Visit via Video Note  I connected with Becky Gilmore on 05/13/19 at 11:45 AM EDT by a video enabled telemedicine application and verified that I am speaking with the correct person using two identifiers.  Location: Patient: in her home Provider: [facility] Persons participating in virtual visit- patient and provider  I discussed the limitations of evaluation and management by telemedicine and the availability of in person appointments. The patient expressed understanding and agreed to proceed.  History of Present Illness: Chief Complaint  Patient presents with  . Anxiety    c/o anxiety x 3 months. Would like FMLA forms filled as well   She has noticed increased anxiety over last couple of months. Feels like it is related to work. 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 The people she works with are "messy," there are a lot of passengers with different personalities. Having difficulty dealing with people, more irritable. Doesn't feel like employer is very responsive. She has been looking for a new job. Difficulty with sleep. Works M-F. Sleeps better on days off/ weekends. Trouble going and staying asleep. Has been at job for 3 years.  She denies significant stress at home.  She has a teenage son who has autism.  Her mother lives with her part-time.  She reports good family support She has never been on medication for anxiety or depression.  She has never had any counseling.   Observations/Objective: Patient is alert and answers questions appropriately.  Visible skin is unremarkable.  Respirations are even and unlabored without increased work of breathing, audible wheeze or witnessed cough.  She is a little teary during interview. Depression screen [facility] 2/9 05/13/2019 05/11/2019 01/08/2018  Decreased Interest 0 0 0  Down, Depressed, Hopeless 2 0 0  PHQ - 2 Score 2 0 0  Altered sleeping - 0 -  Tired, decreased energy - 0 -  Change in appetite - 0 -  Feeling bad or failure  about yourself  - 0 -  Trouble concentrating - 0 -  Moving slowly or fidgety/restless - 0 -  Suicidal thoughts - 0 -  PHQ-9 Score - 0 -  Difficult doing work/chores - Not difficult at all -   GAD 7 : Generalized Anxiety Score 05/13/2019  Nervous, Anxious, on Edge 1  Control/stop worrying 0  Worry too much - different things 2  Trouble relaxing 0  Restless 0  Easily annoyed or irritable 3  Afraid - awful might happen 0  Total GAD 7 Score 6  Anxiety Difficulty Somewhat difficult     Assessment and Plan: 1. Anxiety -She feels that this is primarily due to her job stressors -Discussed next steps including speaking with her supervisor and human resources.  I am okay to give her a little time off if she needs it to obtain counseling and work on improved sleep.  Discussed that she would likely need to talk with her HR about FMLA. -Follow-up in 8 weeks, sooner if worsening symptoms  2. Psychophysiological insomnia -Discussed starting with over-the-counter medications and working on sleep hygiene   Olean Ree, FNP-BC  [facility], [facility]  05/15/2019 8:31 AM   Follow Up Instructions:    I discussed the assessment and treatment plan with the patient. The patient was provided an opportunity to ask questions and all were answered. The patient agreed with the plan and demonstrated an understanding of the instructions.   The patient was advised to call back  or seek an in-person evaluation if the symptoms worsen or if the condition fails to improve as anticipated.    Elby Beck, FNP

## 2019-05-13 NOTE — Addendum Note (Signed)
Addended by: Olean Ree B on: 05/13/2019 04:42 PM   Modules accepted: Orders

## 2019-05-15 ENCOUNTER — Encounter: Payer: Self-pay | Admitting: Family Medicine

## 2019-05-18 ENCOUNTER — Telehealth: Payer: Self-pay | Admitting: Family Medicine

## 2019-05-18 NOTE — Telephone Encounter (Signed)
FMLA paperwork placed in Debbie's basket.

## 2019-05-23 ENCOUNTER — Encounter: Payer: Self-pay | Admitting: Family Medicine

## 2019-05-25 ENCOUNTER — Other Ambulatory Visit: Payer: BC Managed Care – PPO

## 2019-05-25 ENCOUNTER — Ambulatory Visit: Payer: BC Managed Care – PPO | Attending: Internal Medicine

## 2019-05-25 DIAGNOSIS — Z20822 Contact with and (suspected) exposure to covid-19: Secondary | ICD-10-CM

## 2019-05-25 NOTE — Telephone Encounter (Signed)
Form completed and given to Gillis Santa to fax, send to scan.

## 2019-05-27 LAB — SARS-COV-2, NAA 2 DAY TAT

## 2019-05-27 LAB — NOVEL CORONAVIRUS, NAA: SARS-CoV-2, NAA: NOT DETECTED

## 2019-06-06 ENCOUNTER — Ambulatory Visit: Payer: Self-pay | Admitting: Obstetrics and Gynecology

## 2019-06-17 ENCOUNTER — Other Ambulatory Visit (HOSPITAL_COMMUNITY)
Admission: RE | Admit: 2019-06-17 | Discharge: 2019-06-17 | Disposition: A | Discharge status: Home / Self Care | Payer: BC Managed Care – PPO | Source: Ambulatory Visit | Attending: Obstetrics and Gynecology | Admitting: Obstetrics and Gynecology

## 2019-06-17 ENCOUNTER — Other Ambulatory Visit: Payer: Self-pay

## 2019-06-17 ENCOUNTER — Ambulatory Visit (INDEPENDENT_AMBULATORY_CARE_PROVIDER_SITE_OTHER): Payer: BC Managed Care – PPO | Admitting: Obstetrics and Gynecology

## 2019-06-17 ENCOUNTER — Encounter: Payer: Self-pay | Admitting: Obstetrics and Gynecology

## 2019-06-17 ENCOUNTER — Ambulatory Visit: Payer: BC Managed Care – PPO | Admitting: Obstetrics and Gynecology

## 2019-06-17 ENCOUNTER — Ambulatory Visit: Payer: BC Managed Care – PPO

## 2019-06-17 VITALS — BP 104/76 | HR 92 | Ht 63.0 in | Wt 192.0 lb

## 2019-06-17 DIAGNOSIS — N72 Inflammatory disease of cervix uteri: Secondary | ICD-10-CM

## 2019-06-17 DIAGNOSIS — B977 Papillomavirus as the cause of diseases classified elsewhere: Secondary | ICD-10-CM

## 2019-06-17 NOTE — Progress Notes (Signed)
Obstetrics & Gynecology Office Visit   Chief Complaint:  Chief Complaint  Patient presents with  . Colposcopy    Referred by [facility] abnormal PAP    History of Present Illness:Becky Gilmore is a 39 y.o. woman who presents today for continued surveillance for history of dysplasia. Last pap obtained on 05/10/2017 revealed NILM HPV positive (no subtyping but preceding pap 01/08/2018 HPV 18 positive).  Patient has not experienced any unusual vaginal bleeding, pelvic pain, or discharge.    Pap/Treatment History:  01/08/2018 NIL HPV positive (18 positive) 05/11/2019 NIL HPV positive  Review of Systems: Review of Systems  Constitutional: Negative.   Genitourinary: Negative.   Skin: Negative.    Past Medical History:  Patient Active Problem List   Diagnosis Date Noted  . BMI 32.0-32.9,adult 05/11/2019  . Chronic tension-type headache, not intractable 05/11/2019  . Low back pain 01/04/2019  . Headache 01/04/2019  . Urinary frequency 01/04/2019  . Abdominal pain 01/04/2019    Past Surgical History:  Patient Active Problem List   Diagnosis Date Noted  . BMI 32.0-32.9,adult 05/11/2019  . Chronic tension-type headache, not intractable 05/11/2019  . Low back pain 01/04/2019  . Headache 01/04/2019  . Urinary frequency 01/04/2019  . Abdominal pain 01/04/2019    Gynecologic History: Patient's last menstrual period was 06/09/2019.  Obstetric History: G1P1  Family History:  Family History  Problem Relation Age of Onset  . Diabetes Mother     Social History:  Social History   Socioeconomic History  . Marital status: Unknown    Spouse name: Not on file  . Number of children: Not on file  . Years of education: Not on file  . Highest education level: Not on file  Occupational History  . Not on file  Tobacco Use  . Smoking status: Former Games developer  . Smokeless tobacco: Never Used  Substance and Sexual Activity  . Alcohol use: Yes    Comment: occ  . Drug use: Never  .  Sexual activity: Yes    Partners: Male    Birth control/protection: Condom  Other Topics Concern  . Not on file  Social History Narrative  . Not on file   Social Determinants of Health   Financial Resource Strain:   . Difficulty of Paying Living Expenses:   Food Insecurity:   . Worried About Programme researcher, broadcasting/film/video in the Last Year:   . Barista in the Last Year:   Transportation Needs:   . Freight forwarder (Medical):   Marland Kitchen Lack of Transportation (Non-Medical):   Physical Activity:   . Days of Exercise per Week:   . Minutes of Exercise per Session:   Stress:   . Feeling of Stress :   Social Connections:   . Frequency of Communication with Friends and Family:   . Frequency of Social Gatherings with Friends and Family:   . Attends Religious Services:   . Active Member of Clubs or Organizations:   . Attends Banker Meetings:   Marland Kitchen Marital Status:   Intimate Partner Violence:   . Fear of Current or Ex-Partner:   . Emotionally Abused:   Marland Kitchen Physically Abused:   . Sexually Abused:     Allergies:  No Known Allergies  Medications: Prior to Admission medications   Not on File    Physical Exam Vitals:  Vitals:   06/17/19 1056  BP: 104/76  Pulse: 92   Patient's last menstrual period was 06/09/2019.  General:  NAD, well nourished, appears stated age HEENT: normocephalic, anicteric Pulmonary: No increased work of breathing Genitourinary:  External: Normal external female genitalia.  Normal urethral meatus, normal Bartholin's and Skene's glands.    Vagina: Normal vaginal mucosa, no evidence of prolapse.    Cervix: Grossly normal in appearance, no bleeding Extremities: no edema, erythema, or tenderness Neurologic: Grossly intact Psychiatric: mood appropriate, affect full  Female chaperone present for pelvic and breast  portions of the physical exam  GYNECOLOGY CLINIC COLPOSCOPY PROCEDURE NOTE  39 y.o. G1P1 here for colposcopy for NIL and HR HPV+   pap smear on 05/25/2019. Discussed underlying role for HPV infection in the development of cervical dysplasia, its natural history and progression/regression, need for surveillance.  Is the patient  pregnant: No LMP: Patient's last menstrual period was 06/09/2019. Smoking status:  reports that she has quit smoking. She has never used smokeless tobacco. Contraception: condoms  Patient given informed consent, signed copy in the chart, time out was performed.  The patient was position in dorsal lithotomy position. Speculum was placed the cervix was visualized.   After application of acetic acid colposcopic inspection of the cervix was undertaken.   Colposcopy adequate, full visualization of transformation zone: Yes no visible lesions; corresponding biopsies obtained (random 12 O'Clock).   ECC specimen obtained:  Yes  All specimens were labeled and sent to pathology.   Patient was given post procedure instructions.  Will follow up pathology and manage accordingly.  Routine preventative health maintenance measures emphasized.     Assessment: 39 y.o. G1P1 follow up for NILM HPV positive pap  Plan: Problem List Items Addressed This Visit    None    Visit Diagnoses    High risk human papilloma virus (HPV) infection of cervix    -  Primary   Relevant Orders   Surgical pathology      - Colposcopy performed today  - I had a lengthly discussion with Perlie Mayo  regarding the cause of dysplasia of the lower genital tract (including immunosuppression in the setting of HPV exposure and tobacco exposure). I explained the potential for progression to invasive malignancy, the recurrent nature of these lesions (and the need for close continued followup). Results of today's pap will dictate need for further evaluation and follow up per ASCCP guidelines..  - We discussed that 80% of the population will have exposure to human papilloma virus (HPV) during their lifetime.  HPV is a large group of  viruses, and are also the causative virus for common warts and genital warts.  The pap smear tests for 13 high risk HPV strains that have some association with cervical cancer, but do not cause visual lesion such as warts.  HPV type 16 and 18 have the highest association with cervical cancer.  The vast majority of HPV infections will be uncomplicated at clear spontaneously in 12-18 months in non-immunocompromised patients.  Patient with compromised immune systems, those taking immunosuppressive drugs, or smoker have shown to have a lower clearance rate and higher persistence of HPV infection.    Currently there are no FDA approved treatments to promote HPV clearance.  Gardasil vaccination is available to prevent HPV infection, but this is only beneficial pre-exposure.  Abstaining from intercourse will not increase clearance  Lastly we stressed that if properly followed HPV should not lead to cervical cancer.   The goal of screening is to identify patient who develop precancerous lesions of the cervix and treat these prior to progression to frank cervical cancer.  The incidence of cervical cancer is 7 cases per 100,000 women in the [place] a year.  This relatively low rate is in part due to universal screening as well as vaccination efforts.    - She is comfortable with the plan and had her questions answered.  - A total of 20 minutes were spent in face-to-face contact with the patient during this encounter with over half of that time devoted to counseling and coordination of care.  - Return in about 1 year (around 06/16/2020) for pap.   Malachy Mood, MD, [facility], [facility] 06/17/2019, 11:31 AM

## 2019-06-20 LAB — SURGICAL PATHOLOGY

## 2019-07-08 ENCOUNTER — Ambulatory Visit: Payer: BC Managed Care – PPO | Admitting: Family Medicine

## 2019-07-11 ENCOUNTER — Telehealth: Payer: Self-pay | Admitting: Family Medicine

## 2019-07-11 NOTE — Telephone Encounter (Signed)
Patient applied for a job with [employer] and she needs a copy of her immunization records.  Please call patient when records are ready.

## 2019-07-13 NOTE — Telephone Encounter (Signed)
Patient called back about getting her shot records. She stated she is needing this for the job she applied for

## 2019-07-13 NOTE — Telephone Encounter (Signed)
Pt aware that immunization records that we have on file and [facility] have been placed up front to be picked up. Pt aware to coordinate with [place] if there is anything she needs to do for the missing vaccines - titers or get updated vaccines. Pt will reach out to [facility] HR.   Nothing further needed.

## 2019-12-05 ENCOUNTER — Ambulatory Visit: Payer: Self-pay

## 2020-02-09 ENCOUNTER — Ambulatory Visit
Admission: EM | Admit: 2020-02-09 | Discharge: 2020-02-09 | Disposition: A | Discharge status: Home / Self Care | Payer: 59 | Attending: Family Medicine | Admitting: Family Medicine

## 2020-02-09 ENCOUNTER — Encounter: Payer: Self-pay | Admitting: Family Medicine

## 2020-02-09 DIAGNOSIS — H9203 Otalgia, bilateral: Secondary | ICD-10-CM

## 2020-02-09 DIAGNOSIS — Z7689 Persons encountering health services in other specified circumstances: Secondary | ICD-10-CM | POA: Diagnosis not present

## 2020-02-09 DIAGNOSIS — J029 Acute pharyngitis, unspecified: Secondary | ICD-10-CM

## 2020-02-09 MED ORDER — FLUTICASONE PROPIONATE 50 MCG/ACT NA SUSP
2.0000 | Freq: Every day | NASAL | 2 refills | Status: DC
Start: 2020-02-09 — End: 2020-07-25

## 2020-02-09 MED ORDER — IBUPROFEN 600 MG PO TABS
600.0000 mg | ORAL_TABLET | Freq: Three times a day (TID) | ORAL | 0 refills | Status: DC | PRN
Start: 2020-02-09 — End: 2020-03-20

## 2020-02-09 NOTE — ED Provider Notes (Signed)
Renaldo Fiddler    CSN: [id number] Arrival date & time: 02/09/20  1307      History   Chief Complaint Chief Complaint  Patient presents with  . Sore Throat  . Otalgia    HPI Becky Gilmore is a 40 y.o. female.   Patient is a 40 year old female who presents today with bilateral ear pressure, sore throat that is intermittent.  This is been for the past few days or so.  No associated fever, chills.  Mild cough, nasal congestion.  Possible sick exposure     Past Medical History:  Diagnosis Date  . Abnormal Pap smear of cervix 01/2018   cytology negative, HPV positive, repeat pap in 1 year    Patient Active Problem List   Diagnosis Date Noted  . BMI 32.0-32.9,adult 05/11/2019  . Chronic tension-type headache, not intractable 05/11/2019  . Low back pain 01/04/2019  . Headache 01/04/2019  . Urinary frequency 01/04/2019  . Abdominal pain 01/04/2019    Past Surgical History:  Procedure Laterality Date  . CESAREAN SECTION    . genital warts      OB History    Gravida  1   Para  1   Term      Preterm      AB      Living  1     SAB      IAB      Ectopic      Multiple      Live Births               Home Medications    Prior to Admission medications   Medication Sig Start Date End Date Taking? Authorizing Provider  fluticasone (FLONASE) 50 MCG/ACT nasal spray Place 2 sprays into both nostrils daily. 02/09/20  Yes Hanson Medeiros A, NP  ibuprofen (ADVIL) 600 MG tablet Take 1 tablet (600 mg total) by mouth every 8 (eight) hours as needed for moderate pain. 02/09/20  Yes Janace Aris, NP    Family History Family History  Problem Relation Age of Onset  . Diabetes Mother     Social History Social History   Tobacco Use  . Smoking status: Former Games developer  . Smokeless tobacco: Never Used  Vaping Use  . Vaping Use: Never used  Substance Use Topics  . Alcohol use: Yes    Comment: occ  . Drug use: Never     Allergies   Patient has no  known allergies.   Review of Systems Review of Systems   Physical Exam Triage Vital Signs ED Triage Vitals  Enc Vitals Group     BP 02/09/20 1325 112/74     Pulse Rate 02/09/20 1325 88     Resp 02/09/20 1325 18     Temp 02/09/20 1325 98.6 F (37 C)     Temp src --      SpO2 02/09/20 1325 98 %     Weight --      Height --      Head Circumference --      Peak Flow --      Pain Score 02/09/20 1326 9     Pain Loc --      Pain Edu? --      Excl. in GC? --    No data found.  Updated Vital Signs BP 112/74   Pulse 88   Temp 98.6 F (37 C)   Resp 18   SpO2 98%   Visual Acuity Right Eye  Distance:   Left Eye Distance:   Bilateral Distance:    Right Eye Near:   Left Eye Near:    Bilateral Near:     Physical Exam Vitals and nursing note reviewed.  Constitutional:      General: She is not in acute distress.    Appearance: Normal appearance. She is not ill-appearing, toxic-appearing or diaphoretic.  HENT:     Head: Normocephalic.     Right Ear: Tympanic membrane and ear canal normal.     Left Ear: Tympanic membrane and ear canal normal.     Nose: Nose normal.     Mouth/Throat:     Pharynx: Oropharynx is clear.  Eyes:     Conjunctiva/sclera: Conjunctivae normal.  Pulmonary:     Effort: Pulmonary effort is normal.  Musculoskeletal:        General: Normal range of motion.     Cervical back: Normal range of motion.  Skin:    General: Skin is warm and dry.     Findings: No rash.  Neurological:     Mental Status: She is alert.  Psychiatric:        Mood and Affect: Mood normal.      UC Treatments / Results  Labs (all labs ordered are listed, but only abnormal results are displayed) Labs Reviewed  COVID-19, FLU A+B NAA    EKG   Radiology No results found.  Procedures Procedures (including critical care time)  Medications Ordered in UC Medications - No data to display  Initial Impression / Assessment and Plan / UC Course  I have reviewed the  triage vital signs and the nursing notes.  Pertinent labs & imaging results that were available during my care of the patient were reviewed by me and considered in my medical decision making (see chart for details).     Ear pain, sore throat No concerns on exam.  Mostly something viral versus eustachian tube Flonase daily Ibuprofen as needed.  Warm salt water gargles to the throat as needed. Follow up as needed for continued or worsening symptoms  Final Clinical Impressions(s) / UC Diagnoses   Final diagnoses:  Ear pain, bilateral  Sore throat     Discharge Instructions     This is most likely something viral.  You can do Flonase nasal spray to help with congestion, ear tube inflammation Ibuprofen for pain every 8 hours as needed.  Warm salt water gargles to the throat. Follow up as needed for continued or worsening symptoms     ED Prescriptions    Medication Sig Dispense Auth. Provider   fluticasone (FLONASE) 50 MCG/ACT nasal spray Place 2 sprays into both nostrils daily. 16 g Nikoloz Huy A, NP   ibuprofen (ADVIL) 600 MG tablet Take 1 tablet (600 mg total) by mouth every 8 (eight) hours as needed for moderate pain. 30 tablet Dahlia Byes A, NP     PDMP not reviewed this encounter.   Janace Aris, NP 02/09/20 1335

## 2020-02-09 NOTE — Discharge Instructions (Addendum)
This is most likely something viral.  You can do Flonase nasal spray to help with congestion, ear tube inflammation Ibuprofen for pain every 8 hours as needed.  Warm salt water gargles to the throat. Follow up as needed for continued or worsening symptoms

## 2020-02-12 LAB — COVID-19, FLU A+B NAA
Influenza A, NAA: NOT DETECTED
Influenza B, NAA: NOT DETECTED
SARS-CoV-2, NAA: NOT DETECTED

## 2020-03-20 ENCOUNTER — Encounter: Payer: Self-pay | Admitting: Emergency Medicine

## 2020-03-20 ENCOUNTER — Ambulatory Visit
Admission: EM | Admit: 2020-03-20 | Discharge: 2020-03-20 | Disposition: A | Discharge status: Home / Self Care | Payer: 59 | Attending: Emergency Medicine | Admitting: Emergency Medicine

## 2020-03-20 ENCOUNTER — Other Ambulatory Visit: Payer: Self-pay

## 2020-03-20 ENCOUNTER — Ambulatory Visit: Payer: Self-pay

## 2020-03-20 DIAGNOSIS — M546 Pain in thoracic spine: Secondary | ICD-10-CM | POA: Diagnosis not present

## 2020-03-20 DIAGNOSIS — M545 Low back pain, unspecified: Secondary | ICD-10-CM

## 2020-03-20 MED ORDER — IBUPROFEN 800 MG PO TABS
800.0000 mg | ORAL_TABLET | Freq: Three times a day (TID) | ORAL | 0 refills | Status: DC
Start: 2020-03-20 — End: 2020-03-25

## 2020-03-20 MED ORDER — TIZANIDINE HCL 4 MG PO TABS: 2.0000 mg | ORAL_TABLET | Freq: Four times a day (QID) | ORAL | 0 refills | Status: DC | PRN

## 2020-03-20 NOTE — Discharge Instructions (Signed)
Use anti-inflammatories for pain/swelling. You may take up to 800 mg Ibuprofen every 8 hours with food. You may supplement Ibuprofen with Tylenol [phone] mg every 8 hours.  Supplement tizanidine which is a muscle relaxer, do not drive or work after taking, may cause drowsiness Alternate ice and heat Gentle stretching, avoid complete bed rest  Follow-up if any symptoms not improving or worsening

## 2020-03-20 NOTE — ED Triage Notes (Signed)
Pt sts HA upon waking from sleep and upper back pain today

## 2020-03-21 NOTE — ED Provider Notes (Signed)
[name]    CSN: [id number] Arrival date & time: 03/20/20  1644      History   Chief Complaint Chief Complaint  Patient presents with  . Back Pain  . Headache    HPI Becky Gilmore is a 40 y.o. female presenting today for evaluation of back pain and headache.  Reports woke up today with upper back and neck pain.  Pain has been radiating down into the back.  Denies radiation into extremities.  Due to severity of pain has also developed a frontal headache.  Denies vision changes nausea or vomiting associated with headache.  Has not taken any medicine for headache/back pain.  Patient denies any specific injury, increase in activity or heavy lifting.  Patient does work on her feet and does often frequent bending motions.  HPI  Past Medical History:  Diagnosis Date  . Abnormal Pap smear of cervix 01/2018   cytology negative, HPV positive, repeat pap in 1 year    Patient Active Problem List   Diagnosis Date Noted  . BMI 32.0-32.9,adult 05/11/2019  . Chronic tension-type headache, not intractable 05/11/2019  . Low back pain 01/04/2019  . Headache 01/04/2019  . Urinary frequency 01/04/2019  . Abdominal pain 01/04/2019    Past Surgical History:  Procedure Laterality Date  . CESAREAN SECTION    . genital warts      OB History    Gravida  1   Para  1   Term      Preterm      AB      Living  1     SAB      IAB      Ectopic      Multiple      Live Births               Home Medications    Prior to Admission medications   Medication Sig Start Date End Date Taking? Authorizing Provider  ibuprofen (ADVIL) 800 MG tablet Take 1 tablet (800 mg total) by mouth 3 (three) times daily. 03/20/20  Yes Jhony Antrim C, PA-C  tiZANidine (ZANAFLEX) 4 MG tablet Take 0.5-1 tablets (2-4 mg total) by mouth every 6 (six) hours as needed for muscle spasms. 03/20/20  Yes Chanee Henrickson C, PA-C  fluticasone (FLONASE) 50 MCG/ACT nasal spray Place 2 sprays  into both nostrils daily. 02/09/20   Janace Aris, NP    Family History Family History  Problem Relation Age of Onset  . Diabetes Mother     Social History Social History   Tobacco Use  . Smoking status: Former Games developer  . Smokeless tobacco: Never Used  Vaping Use  . Vaping Use: Never used  Substance Use Topics  . Alcohol use: Yes    Comment: occ  . Drug use: Never     Allergies   Patient has no known allergies.   Review of Systems Review of Systems  Constitutional: Negative for fatigue and fever.  Eyes: Negative for visual disturbance.  Respiratory: Negative for shortness of breath.   Cardiovascular: Negative for chest pain.  Gastrointestinal: Negative for abdominal pain, nausea and vomiting.  Musculoskeletal: Positive for back pain and myalgias. Negative for arthralgias and joint swelling.  Skin: Negative for color change, rash and wound.  Neurological: Negative for dizziness, weakness, light-headedness and headaches.     Physical Exam Triage Vital Signs ED Triage Vitals  Enc Vitals Group     BP 03/20/20 1823 105/73     Pulse  Rate 03/20/20 1823 85     Resp 03/20/20 1823 18     Temp 03/20/20 1823 98.1 F (36.7 C)     Temp Source 03/20/20 1823 Oral     SpO2 03/20/20 1823 98 %     Weight --      Height --      Head Circumference --      Peak Flow --      Pain Score 03/20/20 1826 9     Pain Loc --      Pain Edu? --      Excl. in GC? --    No data found.  Updated Vital Signs BP 105/73 (BP Location: Left Arm)   Pulse 85   Temp 98.1 F (36.7 C) (Oral)   Resp 18   SpO2 98%   Visual Acuity Right Eye Distance:   Left Eye Distance:   Bilateral Distance:    Right Eye Near:   Left Eye Near:    Bilateral Near:     Physical Exam Vitals and nursing note reviewed.  Constitutional:      Appearance: She is well-developed and well-nourished.     Comments: No acute distress  HENT:     Head: Normocephalic and atraumatic.     Nose: Nose normal.  Eyes:      Conjunctiva/sclera: Conjunctivae normal.  Cardiovascular:     Rate and Rhythm: Normal rate.  Pulmonary:     Effort: Pulmonary effort is normal. No respiratory distress.     Comments: Breathing comfortably at rest, CTABL, no wheezing, rales or other adventitious sounds auscultated  Abdominal:     General: There is no distension.  Musculoskeletal:        General: Normal range of motion.     Cervical back: Neck supple.     Comments: Nontender to palpation of cervical, thoracic and lumbar spine midline, no palpable deformity or step-off, tenderness to palpation of bilateral mid to lower thoracic areas extending into right lumbar area  Strength at hips and knees 5/5 ankle bilaterally, shoulder strength 5/5 ankle bilaterally  Skin:    General: Skin is warm and dry.  Neurological:     Mental Status: She is alert and oriented to person, place, and time.  Psychiatric:        Mood and Affect: Mood and affect normal.      UC Treatments / Results  Labs (all labs ordered are listed, but only abnormal results are displayed) Labs Reviewed - No data to display  EKG   Radiology No results found.  Procedures Procedures (including critical care time)  Medications Ordered in UC Medications - No data to display  Initial Impression / Assessment and Plan / UC Course  I have reviewed the triage vital signs and the nursing notes.  Pertinent labs & imaging results that were available during my care of the patient were reviewed by me and considered in my medical decision making (see chart for details).     Thoracic/lumbar back pain-no acute mechanism of injury, suspect most likely muscle straining/inflammation, no other symptoms indicating other pathology.  Recommending anti-inflammatories and muscle relaxers with close monitoring.  Discussed strict return precautions. Patient verbalized understanding and is agreeable with plan.  Final Clinical Impressions(s) / UC Diagnoses   Final  diagnoses:  Acute bilateral thoracic back pain  Acute right-sided low back pain without sciatica     Discharge Instructions     Use anti-inflammatories for pain/swelling. You may take up to 800 mg Ibuprofen  every 8 hours with food. You may supplement Ibuprofen with Tylenol [phone] mg every 8 hours.  Supplement tizanidine which is a muscle relaxer, do not drive or work after taking, may cause drowsiness Alternate ice and heat Gentle stretching, avoid complete bed rest  Follow-up if any symptoms not improving or worsening   ED Prescriptions    Medication Sig Dispense Auth. Provider   ibuprofen (ADVIL) 800 MG tablet Take 1 tablet (800 mg total) by mouth 3 (three) times daily. 21 tablet Jhordan Kinter C, PA-C   tiZANidine (ZANAFLEX) 4 MG tablet Take 0.5-1 tablets (2-4 mg total) by mouth every 6 (six) hours as needed for muscle spasms. 30 tablet Lunetta Marina, Belford C, PA-C     PDMP not reviewed this encounter.   Lew Dawes, PA-C 03/21/20 1110

## 2020-03-25 ENCOUNTER — Encounter (HOSPITAL_COMMUNITY): Payer: Self-pay

## 2020-03-25 ENCOUNTER — Other Ambulatory Visit: Payer: Self-pay

## 2020-03-25 ENCOUNTER — Ambulatory Visit (HOSPITAL_COMMUNITY)
Admission: RE | Admit: 2020-03-25 | Discharge: 2020-03-25 | Disposition: A | Discharge status: Home / Self Care | Payer: 59 | Source: Ambulatory Visit | Attending: Emergency Medicine | Admitting: Emergency Medicine

## 2020-03-25 VITALS — BP 113/78 | HR 86 | Temp 97.9°F | Resp 18

## 2020-03-25 DIAGNOSIS — M545 Low back pain, unspecified: Secondary | ICD-10-CM

## 2020-03-25 DIAGNOSIS — M940 Chondrocostal junction syndrome [Tietze]: Secondary | ICD-10-CM | POA: Diagnosis not present

## 2020-03-25 MED ORDER — CYCLOBENZAPRINE HCL 5 MG PO TABS: 5.0000 mg | ORAL_TABLET | Freq: Three times a day (TID) | ORAL | 0 refills | Status: DC | PRN

## 2020-03-25 MED ORDER — MELOXICAM 15 MG PO TABS
15.0000 mg | ORAL_TABLET | Freq: Every day | ORAL | 0 refills | Status: DC
Start: 2020-03-25 — End: 2021-04-10

## 2020-03-25 NOTE — ED Triage Notes (Signed)
Pt returns for c/o back pain. Pt was seen on 15th for same but states its her lower back and also reports chest hurting when she coughs, was given muscle relaxer and stated it has helped some but not taking regularly

## 2020-03-25 NOTE — ED Provider Notes (Signed)
[facility]    CSN: [id number] Arrival date & time: 03/25/20  1347      History   Chief Complaint Chief Complaint  Patient presents with  . Back Pain    HPI Becky Gilmore is a 40 y.o. female.   HPI   Back Pain: Patient was last seen on 03/20/2020 for back pain.  At this time her symptoms appear to be musculoskeletal in nature.  Given the history and physical exam finding this appeared to be muscle straining or inflammation and anti-inflammatories and muscle relaxers were recommended.  Patient presents today for recheck.  She states that she is now having lower back discomfort and also having some chest discomfort when she coughs.  She states that the muscle relaxers have helped with her upper back and neck pain which has now resolved but she is now having lower back pain. She denies any numbness of groin, incontinence or muscle weakness. No sign of sciatica. No new injury. No fevers or neck stiffness. In terms of her pain with cough she denies any fever, productive cough, excessive cough or other cold symptoms. No chest pain with activity.   Past Medical History:  Diagnosis Date  . Abnormal Pap smear of cervix 01/2018   cytology negative, HPV positive, repeat pap in 1 year    Patient Active Problem List   Diagnosis Date Noted  . BMI 32.0-32.9,adult 05/11/2019  . Chronic tension-type headache, not intractable 05/11/2019  . Low back pain 01/04/2019  . Headache 01/04/2019  . Urinary frequency 01/04/2019  . Abdominal pain 01/04/2019    Past Surgical History:  Procedure Laterality Date  . CESAREAN SECTION    . genital warts      OB History    Gravida  1   Para  1   Term      Preterm      AB      Living  1     SAB      IAB      Ectopic      Multiple      Live Births               Home Medications    Prior to Admission medications   Medication Sig Start Date End Date Taking? Authorizing Provider  fluticasone (FLONASE) 50 MCG/ACT  nasal spray Place 2 sprays into both nostrils daily. 02/09/20   Dahlia Byes A, NP  ibuprofen (ADVIL) 800 MG tablet Take 1 tablet (800 mg total) by mouth 3 (three) times daily. 03/20/20   Wieters, Hallie C, PA-C  tiZANidine (ZANAFLEX) 4 MG tablet Take 0.5-1 tablets (2-4 mg total) by mouth every 6 (six) hours as needed for muscle spasms. 03/20/20   Wieters, Junius Creamer, PA-C    Family History Family History  Problem Relation Age of Onset  . Diabetes Mother     Social History Social History   Tobacco Use  . Smoking status: Former Games developer  . Smokeless tobacco: Never Used  Vaping Use  . Vaping Use: Never used  Substance Use Topics  . Alcohol use: Yes    Comment: occ  . Drug use: Never     Allergies   Patient has no known allergies.   Review of Systems Review of Systems  As stated above in HPI Physical Exam Triage Vital Signs ED Triage Vitals  Enc Vitals Group     BP 03/25/20 1432 113/78     Pulse Rate 03/25/20 1432 86     Resp  03/25/20 1432 18     Temp 03/25/20 1432 97.9 F (36.6 C)     Temp src --      SpO2 03/25/20 1432 98 %     Weight --      Height --      Head Circumference --      Peak Flow --      Pain Score 03/25/20 1437 7     Pain Loc --      Pain Edu? --      Excl. in GC? --    No data found.  Updated Vital Signs BP 113/78   Pulse 86   Temp 97.9 F (36.6 C)   Resp 18   LMP 03/10/2020   SpO2 98%   Physical Exam Vitals and nursing note reviewed.  Constitutional:      General: She is not in acute distress.    Appearance: Normal appearance. She is not ill-appearing, toxic-appearing or diaphoretic.  HENT:     Head: Normocephalic.  Eyes:     Extraocular Movements: Extraocular movements intact.     Pupils: Pupils are equal, round, and reactive to light.  Cardiovascular:     Rate and Rhythm: Normal rate and regular rhythm.  Pulmonary:     Effort: Pulmonary effort is normal.     Breath sounds: Normal breath sounds.  Musculoskeletal:       Arms:      Cervical back: Normal, normal range of motion and neck supple. No rigidity.     Thoracic back: Normal.     Lumbar back: Spasms and tenderness present. No swelling, edema, deformity, signs of trauma, lacerations or bony tenderness. Normal range of motion. Negative right straight leg raise test and negative left straight leg raise test. No scoliosis.  Neurological:     General: No focal deficit present.     Mental Status: She is alert and oriented to person, place, and time.     Cranial Nerves: No cranial nerve deficit.     Motor: No weakness.     Coordination: Coordination normal.     Gait: Gait normal.     Deep Tendon Reflexes: Reflexes normal.      UC Treatments / Results  Labs (all labs ordered are listed, but only abnormal results are displayed) Labs Reviewed - No data to display  EKG   Radiology No results found.  Procedures Procedures (including critical care time)  Medications Ordered in UC Medications - No data to display  Initial Impression / Assessment and Plan / UC Course  I have reviewed the triage vital signs and the nursing notes.  Pertinent labs & imaging results that were available during my care of the patient were reviewed by me and considered in my medical decision making (see chart for details).     New. Likely has an inflammatory process occurring in the body. We will switch her ibuprofen to Mobic as this is a bit safer for a longer period of time. We discussed how to use this medication along with common potential side effects and precautions. We also discussed costochondritis. Red flag symptoms and abnormal symptoms discussed with patient. I would like for her to follow-up with her primary care provider next week to ensure that her symptoms have resolved. In addition I am going to increase her muscle relaxer to help with her low back pain. Final Clinical Impressions(s) / UC Diagnoses   Final diagnoses:  None   Discharge Instructions   None  ED Prescriptions    None     PDMP not reviewed this encounter.   Rushie Chestnut, [place] 03/25/20 1452

## 2020-06-12 ENCOUNTER — Encounter: Payer: 59 | Admitting: Family Medicine

## 2020-06-15 ENCOUNTER — Ambulatory Visit (HOSPITAL_BASED_OUTPATIENT_CLINIC_OR_DEPARTMENT_OTHER): Payer: 59 | Admitting: Nurse Practitioner

## 2020-06-28 ENCOUNTER — Encounter: Payer: 59 | Admitting: Family Medicine

## 2020-07-05 ENCOUNTER — Ambulatory Visit: Payer: 59 | Admitting: Internal Medicine

## 2020-07-05 DIAGNOSIS — Z0289 Encounter for other administrative examinations: Secondary | ICD-10-CM

## 2020-07-09 ENCOUNTER — Ambulatory Visit: Payer: BC Managed Care – PPO | Admitting: Family Medicine

## 2020-07-25 ENCOUNTER — Other Ambulatory Visit: Payer: Self-pay

## 2020-07-25 ENCOUNTER — Ambulatory Visit: Payer: 59 | Admitting: Physician Assistant

## 2020-07-25 ENCOUNTER — Other Ambulatory Visit (HOSPITAL_COMMUNITY): Payer: Self-pay

## 2020-07-25 VITALS — BP 102/61 | HR 103 | Temp 98.7°F | Resp 18 | Ht 63.0 in | Wt 208.0 lb

## 2020-07-25 DIAGNOSIS — E782 Mixed hyperlipidemia: Secondary | ICD-10-CM

## 2020-07-25 DIAGNOSIS — Z13220 Encounter for screening for lipoid disorders: Secondary | ICD-10-CM

## 2020-07-25 DIAGNOSIS — E559 Vitamin D deficiency, unspecified: Secondary | ICD-10-CM

## 2020-07-25 DIAGNOSIS — R0602 Shortness of breath: Secondary | ICD-10-CM | POA: Diagnosis not present

## 2020-07-25 DIAGNOSIS — Z6836 Body mass index (BMI) 36.0-36.9, adult: Secondary | ICD-10-CM | POA: Diagnosis not present

## 2020-07-25 DIAGNOSIS — E6609 Other obesity due to excess calories: Secondary | ICD-10-CM

## 2020-07-25 DIAGNOSIS — Z1159 Encounter for screening for other viral diseases: Secondary | ICD-10-CM

## 2020-07-25 DIAGNOSIS — Z0000 Encounter for general adult medical examination without abnormal findings: Secondary | ICD-10-CM

## 2020-07-25 DIAGNOSIS — Z13228 Encounter for screening for other metabolic disorders: Secondary | ICD-10-CM | POA: Diagnosis not present

## 2020-07-25 DIAGNOSIS — R059 Cough, unspecified: Secondary | ICD-10-CM | POA: Diagnosis not present

## 2020-07-25 MED ORDER — ALBUTEROL SULFATE HFA 108 (90 BASE) MCG/ACT IN AERS
2.0000 | INHALATION_SPRAY | Freq: Four times a day (QID) | RESPIRATORY_TRACT | 0 refills | Status: DC | PRN
  Filled 2020-07-25: qty 8.5, 25d supply, fill #0

## 2020-07-25 MED ORDER — CETIRIZINE HCL 10 MG PO TABS
10.0000 mg | ORAL_TABLET | Freq: Every day | ORAL | 11 refills | Status: DC
  Filled 2020-07-25: qty 30, 30d supply, fill #0

## 2020-07-25 NOTE — Patient Instructions (Signed)
To help with your cough, I encourage you to take Zyrtec on a daily basis.  To help with your shortness of breath, I prescribed an albuterol inhaler that you can use as needed.  We will call you with today's lab results.  Please let us know if there is anything else we can do for you.  Roney Jaffe, PA-C Physician Assistant [facility] [email or web address]  Cough, Adult Coughing is a reflex that clears your throat and your airways (respiratory system). Coughing helps to heal and protect your lungs. It is normal to cough occasionally, but a cough that happens with other symptoms or lasts a long time may be a sign of a condition that needs treatment. An acute cough may only last2-3 weeks, while a chronic cough may last 8 or more weeks. Coughing is commonly caused by: Infection of the respiratory systemby viruses or bacteria. Breathing in substances that irritate your lungs. Allergies. Asthma. Mucus that runs down the back of your throat (postnasal drip). Smoking. Acid backing up from the stomach into the esophagus (gastroesophageal reflux). Certain medicines. Chronic lung problems. Other medical conditions such as heart failure or a blood clot in the lung (pulmonary embolism). Follow these instructions at home: Medicines Take over-the-counter and prescription medicines only as told by your health care provider. Talk with your health care provider before you take a cough suppressant medicine. Lifestyle  Avoid cigarette smoke. Do not use any products that contain nicotine or tobacco, such as cigarettes, e-cigarettes, and chewing tobacco. If you need help quitting, ask your health care provider. Drink enough fluid to keep your urine pale yellow. Avoid caffeine. Do not drink alcohol if your health care provider tells you not to drink.  General instructions  Pay close attention to changes in your cough. Tell your health care  provider about them. Always cover your mouth when you cough. Avoid things that make you cough, such as perfume, candles, cleaning products, or campfire or tobacco smoke. If the air is dry, use a cool mist vaporizer or humidifier in your bedroom or your home to help loosen secretions. If your cough is worse at night, try to sleep in a semi-upright position. Rest as needed. Keep all follow-up visits as told by your health care provider. This is important.  Contact a health care provider if you: Have new symptoms. Cough up pus. Have a cough that does not get better after 2-3 weeks or gets worse. Cannot control your cough with cough suppressant medicines and you are losing sleep. Have pain that gets worse or pain that is not helped with medicine. Have a fever. Have unexplained weight loss. Have night sweats. Get help right away if: You cough up blood. You have difficulty breathing. Your heartbeat is very fast. These symptoms may represent a serious problem that is an emergency. Do not wait to see if the symptoms will go away. Get medical help right away. Call your local emergency services (911 in the [place] Do not drive yourself to the hospital. Summary Coughing is a reflex that clears your throat and your airways. It is normal to cough occasionally, but a cough that happens with other symptoms or lasts a long time may be a sign of a condition that needs treatment. Take over-the-counter and prescription medicines only as told by your health care provider. Always cover your mouth when you cough. Contact a health care provider if you have new symptoms or a cough that does not get better after 2-3 weeks or  gets worse. This information is not intended to replace advice given to you by your health care provider. Make sure you discuss any questions you have with your healthcare provider. Document Revised: 02/08/2018 Document Reviewed: 02/08/2018 Elsevier Patient Education  2022 [employer].  Shortness of Breath, Adult Shortness of breath is when a person has trouble breathing enough air or when a person feels like she or he is having trouble breathing in enough air.Shortness of breath could be a sign of a medical problem. Follow these instructions at home:  Pay attention to any changes in your symptoms. Do not use any products that contain nicotine or tobacco, such as cigarettes, e-cigarettes, and chewing tobacco. Do not smoke. Smoking is a common cause of shortness of breath. If you need help quitting, ask your health care provider. Avoid things that can irritate your airways, such as: Mold. Dust. Air pollution. Chemical fumes. Things that can cause allergy symptoms (allergens), if you have allergies. Keep your living space clean and free of mold and dust. Rest as needed. Slowly return to your usual activities. Take over-the-counter and prescription medicines only as told by your health care provider. This includes oxygen therapy and inhaled medicines. Keep all follow-up visits as told by your health care provider. This is important. Contact a health care provider if: Your condition does not improve as soon as expected. You have a hard time doing your normal activities, even after you rest. You have new symptoms. Get help right away if: Your shortness of breath gets worse. You have shortness of breath when you are resting. You feel light-headed or you faint. You have a cough that is not controlled with medicines. You cough up blood. You have pain with breathing. You have pain in your chest, arms, shoulders, or abdomen. You have a fever. You cannot walk up stairs or exercise the way that you normally do. These symptoms may represent a serious problem that is an emergency. Do not wait to see if the symptoms will go away. Get medical help right away. Call your local emergency services (911 in the [place] Do not drive yourself to the hospital. Summary Shortness of  breath is when a person has trouble breathing enough air. It can be a sign of a medical problem. Avoid things that irritate your lungs, such as smoking, pollution, mold, and dust. Pay attention to changes in your symptoms and contact your health care provider if you have a hard time completing daily activities because of shortness of breath. This information is not intended to replace advice given to you by your health care provider. Make sure you discuss any questions you have with your healthcare provider. Document Revised: 06/22/2017 Document Reviewed: 06/22/2017 Elsevier Patient Education  2022 [employer].

## 2020-07-25 NOTE — Progress Notes (Signed)
Patient presents for physical. Patient has not eaten today and patient has not taken medication today. Patient denies pain at this time.

## 2020-07-25 NOTE — Progress Notes (Signed)
New Patient Office Visit  Subjective:  Patient ID: Becky Gilmore, female    DOB: [birth year]  Age: 40 y.o. MRN: [id number]  CC:  Chief Complaint  Patient presents with   Annual Exam    HPI Becky Gilmore presents for a wellness exam with a complaint of cough and shortness of breath.  Reports that she has been having a dry cough for at least the past year.  States it is worse when she is laying down.  Denies any other URI symptoms.  Has not tried anything for relief.  No history of COVID infection.  Reports that she has been having episodes of shortness of breath, states that she will feel shortness of breath during coughing episodes, and will also have shortness of breath when she is exerting herself.  Denies history of smoking, history of asthma.   Past Medical History:  Diagnosis Date   Abnormal Pap smear of cervix 01/2018   cytology negative, HPV positive, repeat pap in 1 year    Past Surgical History:  Procedure Laterality Date   CESAREAN SECTION     genital warts      Family History  Problem Relation Age of Onset   Diabetes Mother     Social History   Socioeconomic History   Marital status: Single    Spouse name: Not on file   Number of children: Not on file   Years of education: Not on file   Highest education level: Not on file  Occupational History   Not on file  Tobacco Use   Smoking status: Never   Smokeless tobacco: Never  Vaping Use   Vaping Use: Never used  Substance and Sexual Activity   Alcohol use: Yes    Comment: occ   Drug use: Never   Sexual activity: Yes    Partners: Male    Birth control/protection: Condom  Other Topics Concern   Not on file  Social History Narrative   Not on file   Social Determinants of Health   Financial Resource Strain: Not on file  Food Insecurity: Not on file  Transportation Needs: Not on file  Physical Activity: Not on file  Stress: Not on file  Social Connections: Not on file  Intimate Partner  Violence: Not on file    ROS Review of Systems  Constitutional:  Negative for chills and fever.  HENT:  Negative for congestion, ear pain, postnasal drip, sinus pressure, sore throat and trouble swallowing.   Eyes: Negative.   Respiratory:  Positive for cough and shortness of breath. Negative for wheezing.   Cardiovascular:  Negative for chest pain.  Gastrointestinal: Negative.   Endocrine: Negative.   Genitourinary: Negative.   Musculoskeletal: Negative.   Allergic/Immunologic: Negative.   Neurological: Negative.   Hematological: Negative.   Psychiatric/Behavioral: Negative.     Objective:   Today's Vitals: BP 102/61 (BP Location: Left Arm, Patient Position: Sitting, Cuff Size: Large)   Pulse (!) 103   Temp 98.7 F (37.1 C) (Oral)   Resp 18   Ht 5\' 3"  (1.6 m)   Wt 208 lb (94.3 kg)   LMP 07/05/2020   SpO2 100%   BMI 36.85 kg/m   Physical Exam Vitals and nursing note reviewed.  Constitutional:      Appearance: Normal appearance.  HENT:     Head: Normocephalic and atraumatic.     Right Ear: Tympanic membrane, ear canal and external ear normal.     Left Ear: Tympanic membrane, ear canal and external  ear normal.     Nose: Nose normal.     Mouth/Throat:     Mouth: Mucous membranes are moist.     Pharynx: Oropharynx is clear.  Eyes:     Extraocular Movements: Extraocular movements intact.     Conjunctiva/sclera: Conjunctivae normal.     Pupils: Pupils are equal, round, and reactive to light.  Cardiovascular:     Rate and Rhythm: Normal rate and regular rhythm.     Pulses: Normal pulses.     Heart sounds: Normal heart sounds.  Pulmonary:     Effort: Pulmonary effort is normal.     Breath sounds: Normal breath sounds. No wheezing.  Musculoskeletal:        General: Normal range of motion.     Cervical back: Normal range of motion and neck supple.  Skin:    General: Skin is warm and dry.  Neurological:     General: No focal deficit present.     Mental Status: She  is alert and oriented to person, place, and time.  Psychiatric:        Mood and Affect: Mood normal.        Behavior: Behavior normal.        Thought Content: Thought content normal.        Judgment: Judgment normal.    Assessment & Plan:   Problem List Items Addressed This Visit       Other   Cough   Relevant Medications   cetirizine (ZYRTEC ALLERGY) 10 MG tablet   Vitamin D deficiency   Relevant Orders   Vitamin D, 25-hydroxy   SOB (shortness of breath) on exertion   Relevant Medications   albuterol (VENTOLIN HFA) 108 (90 Base) MCG/ACT inhaler   Other Visit Diagnoses     Wellness examination    -  Primary   Class 2 obesity due to excess calories without serious comorbidity with body mass index (BMI) of 36.0 to 36.9 in adult       Screening, lipid       Relevant Orders   Lipid panel   Screening for metabolic disorder       Relevant Orders   CBC with Differential/Platelet   Comp. Metabolic Panel (12)   TSH   Encounter for HCV screening test for low risk patient       Relevant Orders   HCV Ab w Reflex to Quant PCR       Outpatient Encounter Medications as of 07/25/2020  Medication Sig   albuterol (VENTOLIN HFA) 108 (90 Base) MCG/ACT inhaler Inhale 2 puffs into the lungs every 6 (six) hours as needed for wheezing or shortness of breath.   cetirizine (ZYRTEC ALLERGY) 10 MG tablet Take 1 tablet (10 mg total) by mouth daily.   cyclobenzaprine (FLEXERIL) 5 MG tablet Take 1 tablet (5 mg total) by mouth 3 (three) times daily as needed for muscle spasms.   meloxicam (MOBIC) 15 MG tablet Take 1 tablet (15 mg total) by mouth daily.   [DISCONTINUED] fluticasone (FLONASE) 50 MCG/ACT nasal spray Place 2 sprays into both nostrils daily.   No facility-administered encounter medications on file as of 07/25/2020.  1. Wellness examination Fasting labs completed today.  Patient is currently without a primary care provider, encouraged patient to work on establishing care.  2.  Cough Trial Zyrtec.  Patient education given on supportive care.  Red flags given for prompt reevaluation - cetirizine (ZYRTEC ALLERGY) 10 MG tablet; Take 1 tablet (10 mg total) by mouth  daily.  Dispense: 30 tablet; Refill: 11  3. SOB (shortness of breath) on exertion Trial Ventolin.  Patient encouraged to keep a log of use of inhaler, red flags given for prompt reevaluation.  Patient education given on supportive care - albuterol (VENTOLIN HFA) 108 (90 Base) MCG/ACT inhaler; Inhale 2 puffs into the lungs every 6 (six) hours as needed for wheezing or shortness of breath.  Dispense: 8.5 g; Refill: 0  4. Vitamin D deficiency  - Vitamin D, 25-hydroxy  5. Class 2 obesity due to excess calories without serious comorbidity with body mass index (BMI) of 36.0 to 36.9 in adult   6. Screening, lipid  - Lipid panel  7. Screening for metabolic disorder  - CBC with Differential/Platelet - Comp. Metabolic Panel (12) - TSH  8. Encounter for HCV screening test for low risk patient  - HCV Ab w Reflex to Quant PCR    I have reviewed the patient's medical history (PMH, PSH, Social History, Family History, Medications, and allergies) , and have been updated if relevant. I spent 31 minutes reviewing chart and  face to face time with patient.   Follow-up: Return if symptoms worsen or fail to improve.   Kasandra Knudsen Mayers, PA-C

## 2020-07-26 ENCOUNTER — Other Ambulatory Visit (HOSPITAL_COMMUNITY): Payer: Self-pay

## 2020-07-26 ENCOUNTER — Encounter: Payer: Self-pay | Admitting: Physician Assistant

## 2020-07-26 DIAGNOSIS — E6609 Other obesity due to excess calories: Secondary | ICD-10-CM | POA: Insufficient documentation

## 2020-07-26 DIAGNOSIS — E782 Mixed hyperlipidemia: Secondary | ICD-10-CM | POA: Insufficient documentation

## 2020-07-26 DIAGNOSIS — E785 Hyperlipidemia, unspecified: Secondary | ICD-10-CM | POA: Insufficient documentation

## 2020-07-26 LAB — CBC WITH DIFFERENTIAL/PLATELET
Basophils Absolute: 0.1 10*3/uL (ref 0.0–0.2)
Basos: 1 %
EOS (ABSOLUTE): 0.1 10*3/uL (ref 0.0–0.4)
Eos: 2 %
Hematocrit: 41.5 % (ref 34.0–46.6)
Hemoglobin: 12.9 g/dL (ref 11.1–15.9)
Immature Grans (Abs): 0 10*3/uL (ref 0.0–0.1)
Immature Granulocytes: 0 %
Lymphocytes Absolute: 2.2 10*3/uL (ref 0.7–3.1)
Lymphs: 27 %
MCH: 25.4 pg — ABNORMAL LOW (ref 26.6–33.0)
MCHC: 31.1 g/dL — ABNORMAL LOW (ref 31.5–35.7)
MCV: 82 fL (ref 79–97)
Monocytes Absolute: 0.4 10*3/uL (ref 0.1–0.9)
Monocytes: 5 %
Neutrophils Absolute: 5.2 10*3/uL (ref 1.4–7.0)
Neutrophils: 65 %
Platelets: 177 10*3/uL (ref 150–450)
RBC: 5.08 x10E6/uL (ref 3.77–5.28)
RDW: 14.4 % (ref 11.7–15.4)
WBC: 8 10*3/uL (ref 3.4–10.8)

## 2020-07-26 LAB — LIPID PANEL
Chol/HDL Ratio: 3.7 ratio (ref 0.0–4.4)
Cholesterol, Total: 247 mg/dL — ABNORMAL HIGH (ref 100–199)
HDL: 67 mg/dL (ref >39)
LDL Chol Calc (NIH): 159 mg/dL — ABNORMAL HIGH (ref 0–99)
Triglycerides: 121 mg/dL (ref 0–149)
VLDL Cholesterol Cal: 21 mg/dL (ref 5–40)

## 2020-07-26 LAB — HCV INTERPRETATION

## 2020-07-26 LAB — COMP. METABOLIC PANEL (12)
AST: 16 IU/L (ref 0–40)
Albumin/Globulin Ratio: 1.4 (ref 1.2–2.2)
Albumin: 4.2 g/dL (ref 3.8–4.8)
Alkaline Phosphatase: 91 IU/L (ref 44–121)
BUN/Creatinine Ratio: 18 (ref 9–23)
BUN: 14 mg/dL (ref 6–20)
Bilirubin Total: 0.3 mg/dL (ref 0.0–1.2)
Calcium: 9.2 mg/dL (ref 8.7–10.2)
Chloride: 104 mmol/L (ref 96–106)
Creatinine, Ser: 0.78 mg/dL (ref 0.57–1.00)
Globulin, Total: 2.9 g/dL (ref 1.5–4.5)
Glucose: 96 mg/dL (ref 65–99)
Potassium: 4.1 mmol/L (ref 3.5–5.2)
Sodium: 137 mmol/L (ref 134–144)
Total Protein: 7.1 g/dL (ref 6.0–8.5)
eGFR: 99 mL/min/{1.73_m2} (ref >59)

## 2020-07-26 LAB — VITAMIN D 25 HYDROXY (VIT D DEFICIENCY, FRACTURES): Vit D, 25-Hydroxy: 17.8 ng/mL — ABNORMAL LOW (ref 30.0–100.0)

## 2020-07-26 LAB — TSH: TSH: 2.29 u[IU]/mL (ref 0.450–4.500)

## 2020-07-26 LAB — HCV AB W REFLEX TO QUANT PCR: HCV Ab: 0.1 s/co ratio (ref 0.0–0.9)

## 2020-07-26 MED ORDER — VITAMIN D (ERGOCALCIFEROL) 1.25 MG (50000 UNIT) PO CAPS
50000.0000 [IU] | ORAL_CAPSULE | ORAL | 2 refills | Status: DC
  Filled 2020-07-26: qty 4, 28d supply, fill #0
  Filled 2020-08-17: qty 4, 28d supply, fill #1
  Filled 2020-09-17: qty 4, 28d supply, fill #2

## 2020-07-26 NOTE — Addendum Note (Signed)
Addended by: Roney Jaffe on: 07/26/2020 09:31 AM   Modules accepted: Orders

## 2020-08-02 ENCOUNTER — Telehealth: Payer: Self-pay | Admitting: *Deleted

## 2020-08-02 NOTE — Telephone Encounter (Signed)
-----   Message from Roney Jaffe, [place] sent at 07/26/2020  9:30 AM EDT ----- Please call patient and let her know that her thyroid function, kidney and liver function are within normal limits.  She does not show signs of anemia.  Her screening for hepatitis C was negative.  Her cholesterol overall is elevated, and her LDL is also elevated.  I strongly recommend that she work on a low-cholesterol diet, and have her cholesterol rechecked in 6 months  Her vitamin D levels are low, she needs to take 50,000 units once a week for the next 12 weeks and have her levels rechecked at that time.  Prescription sent to her pharmacy.

## 2020-08-02 NOTE — Telephone Encounter (Signed)
Patient verified DOB Patient is aware of labs via mychart and has began taking the vitamin d- Thursday's. Patient will follow up for recheck in 3 months and cholesterol in 6 months.

## 2020-08-20 ENCOUNTER — Other Ambulatory Visit (HOSPITAL_COMMUNITY): Payer: Self-pay

## 2020-09-17 ENCOUNTER — Other Ambulatory Visit (HOSPITAL_COMMUNITY): Payer: Self-pay

## 2020-09-25 NOTE — Progress Notes (Signed)
Subjective:    Becky Gilmore - 40 y.o. female MRN [id number]  Date of birth: 1980/07/08  HPI  Fahima Cifelli is to establish care.  Current issues and/or concerns: Migraines ongoing for several years. Happening intermittently. Location of headache varies. Sometimes going 2 weeks without headaches and then returns. Sometimes awakening from sleep. Denies red flag symptoms.   ROS per HPI    Health Maintenance:  Health Maintenance Due  Topic Date Due   HIV Screening  Never done   COVID-19 Vaccine (2 - Pfizer series) 10/31/2019   INFLUENZA VACCINE  09/03/2020    Past Medical History: Patient Active Problem List   Diagnosis Date Noted   Mixed hyperlipidemia 07/26/2020   Class 2 obesity due to excess calories without serious comorbidity with body mass index (BMI) of 36.0 to 36.9 in adult 07/26/2020   Cough 07/25/2020   Vitamin D deficiency 07/25/2020   SOB (shortness of breath) on exertion 07/25/2020   BMI 32.0-32.9,adult 05/11/2019   Chronic tension-type headache, not intractable 05/11/2019   Low back pain 01/04/2019   Headache 01/04/2019   Urinary frequency 01/04/2019   Abdominal pain 01/04/2019    Social History   reports that she has never smoked. She has never used smokeless tobacco. She reports current alcohol use. She reports that she does not use drugs.   Family History  family history includes Diabetes in her mother.   Medications: reviewed and updated   Objective:   Physical Exam BP 118/80 (BP Location: Left Arm, Patient Position: Sitting, Cuff Size: Normal)   Pulse 86   Temp 97.9 F (36.6 C) (Oral)   Resp 16   Ht 5\' 3"  (1.6 m)   Wt 206 lb (93.4 kg)   SpO2 97%   BMI 36.49 kg/m   Physical Exam HENT:     Head: Normocephalic and atraumatic.     Right Ear: Tympanic membrane normal.     Left Ear: Tympanic membrane normal.  Eyes:     Extraocular Movements: Extraocular movements intact.     Conjunctiva/sclera: Conjunctivae normal.     Pupils:  Pupils are equal, round, and reactive to light.  Musculoskeletal:     Cervical back: Normal range of motion and neck supple.  Neurological:     General: No focal deficit present.     Mental Status: She is alert and oriented to person, place, and time.  Psychiatric:        Mood and Affect: Mood normal.        Behavior: Behavior normal.     Assessment & Plan:  1. Encounter to establish care: - Patient presents today to establish care.  - Return for annual physical examination, labs, and health maintenance. Arrive fasting meaning having no food for at least 8 hours prior to appointment. You may have only water or black coffee. Please take scheduled medications as normal.  2. Chronic migraine without aura without status migrainosus, not intractable: - Begin Sumatriptan as prescribed. Discussed adverse side effects and medication compliance.  - Continue over-the-counter Excedrin Migraine. - Counseled on ref flag symptoms and to report to the Emergency Department should this occur. Patient verbalized understanding.  - Follow-up with primary provider in 4 weeks or sooner if needed.  - SUMAtriptan (IMITREX) 25 MG tablet; Take 25 mg (1 tablet total) by mouth at the start of the headache. May repeat in 2 hours x 1 if headache persists. Max of 2 tabs/24 hours  Dispense: 30 tablet; Refill: 0    Patient was given  clear instructions to go to Emergency Department or return to medical center if symptoms don't improve, worsen, or new problems develop.The patient verbalized understanding.  I discussed the assessment and treatment plan with the patient. The patient was provided an opportunity to ask questions and all were answered. The patient agreed with the plan and demonstrated an understanding of the instructions.   The patient was advised to call back or seek an in-person evaluation if the symptoms worsen or if the condition fails to improve as anticipated.    Ricky Stabs, NP 09/26/2020, 9:51  PM Primary Care at [facility]

## 2020-09-26 ENCOUNTER — Other Ambulatory Visit (HOSPITAL_COMMUNITY): Payer: Self-pay

## 2020-09-26 ENCOUNTER — Ambulatory Visit (INDEPENDENT_AMBULATORY_CARE_PROVIDER_SITE_OTHER): Payer: 59 | Admitting: Family

## 2020-09-26 ENCOUNTER — Other Ambulatory Visit: Payer: Self-pay

## 2020-09-26 ENCOUNTER — Encounter: Payer: Self-pay | Admitting: Family

## 2020-09-26 VITALS — BP 118/80 | HR 86 | Temp 97.9°F | Resp 16 | Ht 63.0 in | Wt 206.0 lb

## 2020-09-26 DIAGNOSIS — Z7689 Persons encountering health services in other specified circumstances: Secondary | ICD-10-CM | POA: Diagnosis not present

## 2020-09-26 DIAGNOSIS — G43709 Chronic migraine without aura, not intractable, without status migrainosus: Secondary | ICD-10-CM

## 2020-09-26 MED ORDER — SUMATRIPTAN SUCCINATE 25 MG PO TABS
ORAL_TABLET | ORAL | 0 refills | Status: DC
Start: 2020-09-26 — End: 2022-04-29
  Filled 2020-09-26: qty 18, 30d supply, fill #0

## 2020-09-26 NOTE — Progress Notes (Signed)
Patient only concern today is that she has hx of migraines . Patient would like to talk about with provider. Becky Gilmore

## 2020-10-05 ENCOUNTER — Telehealth: Payer: Self-pay | Admitting: Family

## 2020-10-05 NOTE — Telephone Encounter (Signed)
Called patient to set up appointment to get FMLA papers filled out. Informed patient the first available was 10/31/2020. Patient stated she could not make the appointment because she does not know her work schedule that far out. Patient stated now she will have to start the whole process over again.

## 2020-10-07 NOTE — Progress Notes (Addendum)
Virtual Visit via Telephone Note  I connected with Becky Gilmore, on 10/09/2020 at 11:08 AM by telephone due to the COVID-19 pandemic and verified that I am speaking with the correct person using two identifiers.  Due to current restrictions/limitations of in-office visits due to the COVID-19 pandemic, this scheduled clinical appointment was converted to a telehealth visit.   Consent: I discussed the limitations, risks, security and privacy concerns of performing an evaluation and management service by telephone and the availability of in person appointments. I also discussed with the patient that there may be a patient responsible charge related to this service. The patient expressed understanding and agreed to proceed.   Location of Patient: Home  Location of Provider: [facility]   Persons participating in Telemedicine visit: Marleni Rene Kocher, NP Margorie John, CMA   History of Present Illness: Becky Gilmore is a 40 year-old female who presents for [facility] paperwork completion related to migraines. No issues/concerns.     Past Medical History:  Diagnosis Date   Abnormal Pap smear of cervix 01/2018   cytology negative, HPV positive, repeat pap in 1 year   No Known Allergies  Current Outpatient Medications on File Prior to Visit  Medication Sig Dispense Refill   albuterol (VENTOLIN HFA) 108 (90 Base) MCG/ACT inhaler Inhale 2 puffs into the lungs every 6 (six) hours as needed for wheezing or shortness of breath. 8.5 g 0   cetirizine (ZYRTEC ALLERGY) 10 MG tablet Take 1 tablet (10 mg total) by mouth daily. 30 tablet 11   meloxicam (MOBIC) 15 MG tablet Take 1 tablet (15 mg total) by mouth daily. 14 tablet 0   SUMAtriptan (IMITREX) 25 MG tablet Take 25 mg (1 tablet total) by mouth at the start of the headache. May repeat in 2 hours x 1 if headache persists. Max of 2 tabs/24 hours 30 tablet 0   Vitamin D, Ergocalciferol, (DRISDOL) 1.25  MG (50000 UNIT) CAPS capsule Take 1 capsule (50,000 Units total) by mouth every 7 (seven) days. 4 capsule 2   No current facility-administered medications on file prior to visit.    Observations/Objective: Alert and oriented x 3. Not in acute distress. Physical examination not completed as this is a telemedicine visit.  Assessment and Plan: 1. Encounter for completion of form with patient: 2. Chronic migraine without aura without status migrainosus, not intractable: - FMLA paperwork completed today. Will ask CMA to fax over the completed document.    Follow Up Instructions: Follow-up with primary provider as scheduled.    Patient was given clear instructions to go to Emergency Department or return to medical center if symptoms don't improve, worsen, or new problems develop.The patient verbalized understanding.  I discussed the assessment and treatment plan with the patient. The patient was provided an opportunity to ask questions and all were answered. The patient agreed with the plan and demonstrated an understanding of the instructions.   The patient was advised to call back or seek an in-person evaluation if the symptoms worsen or if the condition fails to improve as anticipated.     I provided 15 minutes total of non-face-to-face time during this encounter.   Rema Fendt, NP  [facility] Primary [facility] at [facility] [place], [place] [phone] 10/09/2020, 11:08 AM

## 2020-10-09 ENCOUNTER — Other Ambulatory Visit: Payer: Self-pay

## 2020-10-09 ENCOUNTER — Encounter: Payer: Self-pay | Admitting: Family

## 2020-10-09 ENCOUNTER — Ambulatory Visit (INDEPENDENT_AMBULATORY_CARE_PROVIDER_SITE_OTHER): Payer: 59 | Admitting: Family

## 2020-10-09 DIAGNOSIS — Z0289 Encounter for other administrative examinations: Secondary | ICD-10-CM | POA: Diagnosis not present

## 2020-10-09 DIAGNOSIS — G43709 Chronic migraine without aura, not intractable, without status migrainosus: Secondary | ICD-10-CM | POA: Diagnosis not present

## 2020-10-09 NOTE — Progress Notes (Signed)
Pt presents for telemedicine visit to discuss FMLA paperwork for migraines

## 2020-10-16 ENCOUNTER — Telehealth: Payer: 59 | Admitting: Family

## 2020-11-29 ENCOUNTER — Other Ambulatory Visit: Payer: Self-pay

## 2020-11-29 ENCOUNTER — Encounter: Payer: Self-pay | Admitting: Intensive Care

## 2020-11-29 ENCOUNTER — Emergency Department
Admission: EM | Admit: 2020-11-29 | Discharge: 2020-11-29 | Disposition: A | Discharge status: Home / Self Care | Payer: 59 | Attending: Emergency Medicine | Admitting: Emergency Medicine

## 2020-11-29 ENCOUNTER — Emergency Department: Payer: 59

## 2020-11-29 DIAGNOSIS — R0789 Other chest pain: Secondary | ICD-10-CM | POA: Insufficient documentation

## 2020-11-29 DIAGNOSIS — R079 Chest pain, unspecified: Secondary | ICD-10-CM

## 2020-11-29 DIAGNOSIS — R091 Pleurisy: Secondary | ICD-10-CM

## 2020-11-29 DIAGNOSIS — Z20822 Contact with and (suspected) exposure to covid-19: Secondary | ICD-10-CM | POA: Diagnosis not present

## 2020-11-29 LAB — D-DIMER, QUANTITATIVE: D-Dimer, Quant: 0.45 ug/mL-FEU (ref 0.00–0.50)

## 2020-11-29 LAB — CBC
HCT: 39.8 % (ref 36.0–46.0)
Hemoglobin: 12.3 g/dL (ref 12.0–15.0)
MCH: 24.8 pg — ABNORMAL LOW (ref 26.0–34.0)
MCHC: 30.9 g/dL (ref 30.0–36.0)
MCV: 80.2 fL (ref 80.0–100.0)
Platelets: 395 10*3/uL (ref 150–400)
RBC: 4.96 MIL/uL (ref 3.87–5.11)
RDW: 15.6 % — ABNORMAL HIGH (ref 11.5–15.5)
WBC: 8.9 10*3/uL (ref 4.0–10.5)
nRBC: 0 % (ref 0.0–0.2)

## 2020-11-29 LAB — BASIC METABOLIC PANEL
Anion gap: 8 (ref 5–15)
BUN: 12 mg/dL (ref 6–20)
CO2: 26 mmol/L (ref 22–32)
Calcium: 8.9 mg/dL (ref 8.9–10.3)
Chloride: 101 mmol/L (ref 98–111)
Creatinine, Ser: 0.92 mg/dL (ref 0.44–1.00)
GFR, Estimated: >60 mL/min (ref >60)
Glucose, Bld: 93 mg/dL (ref 70–99)
Potassium: 3.7 mmol/L (ref 3.5–5.1)
Sodium: 135 mmol/L (ref 135–145)

## 2020-11-29 LAB — TROPONIN I (HIGH SENSITIVITY)
Troponin I (High Sensitivity): 2 ng/L (ref <18)
Troponin I (High Sensitivity): 3 ng/L (ref <18)

## 2020-11-29 LAB — RESP PANEL BY RT-PCR (FLU A&B, COVID) ARPGX2
Influenza A by PCR: NEGATIVE
Influenza B by PCR: NEGATIVE
SARS Coronavirus 2 by RT PCR: NEGATIVE

## 2020-11-29 LAB — POC URINE PREG, ED: Preg Test, Ur: NEGATIVE

## 2020-11-29 MED ORDER — IBUPROFEN 600 MG PO TABS
600.0000 mg | ORAL_TABLET | ORAL | Status: AC
  Administered 2020-11-29: 600 mg via ORAL
  Filled 2020-11-29: qty 1

## 2020-11-29 MED ORDER — ASPIRIN 81 MG PO CHEW
324.0000 mg | CHEWABLE_TABLET | Freq: Once | ORAL | Status: AC
  Administered 2020-11-29: 324 mg via ORAL
  Filled 2020-11-29: qty 4

## 2020-11-29 NOTE — ED Provider Notes (Signed)
[facility] Emergency Department Provider Note   ____________________________________________   Event Date/Time   First MD Initiated Contact with Patient 11/29/20 1031     (approximate)  I have reviewed the triage vital signs and the nursing notes.   HISTORY  Chief Complaint Chest Pain    HPI Becky Gilmore is a 40 y.o. female who reports no long-lasting major medical history.  Denies history of cardiac disease.  Chart and notes history of hyperlipidemia  Felt in her normal health until this morning.  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  No shortness of breath with it, she does have an inhaler and she used that but reports it did not feel like she was wheezing.  Right now her pain and symptoms have gone away but she feels like if she gets up and were to walk a distance that she would start experiencing a sharp discomfort in her primarily mid to left chest  Pain does not radiate no nausea vomiting no fevers or chills no cough no chest pain.  She does report though that she has a chronic cough and does not smoke, but See any particular cough today.  No fevers no runny nose  history of obesity presents for evaluation of chest pain. Initial onset of pain was approximately 1-3 hours ago. The patient's chest pain is well-localized, is sharp and is worse with exertion. The patient's chest pain is middle- or left-sided, is not described as heaviness/pressure/tightness and does not radiate to the arms/jaw/neck. The patient does not complain of nausea and denies diaphoresis. The patient has no history of stroke, has no history of peripheral artery disease, has not smoked in the past 90 days, denies any history of treated diabetes, has no relevant family history of coronary artery disease (first degree relative at less than age 40), is not hypertensive and  has no history of hypercholesterolemia.   No history of blood clots.  No leg swelling.  Does not take any birth control pills.  Denies pregnancy.  No recent surgeries or travel  Past Medical History:  Diagnosis Date   Abnormal Pap smear of cervix 01/2018   cytology negative, HPV positive, repeat pap in 1 year    Patient Active Problem List   Diagnosis Date Noted   Mixed hyperlipidemia 07/26/2020   Class 2 obesity due to excess calories without serious comorbidity with body mass index (BMI) of 36.0 to 36.9 in adult 07/26/2020   Cough 07/25/2020   Vitamin D deficiency 07/25/2020   SOB (shortness of breath) on exertion 07/25/2020   BMI 32.0-32.9,adult 05/11/2019   Chronic tension-type headache, not intractable 05/11/2019   Low back pain 01/04/2019   Headache 01/04/2019   Urinary frequency 01/04/2019   Abdominal pain 01/04/2019    Past Surgical History:  Procedure Laterality Date   CESAREAN SECTION     genital warts      Prior to Admission medications   Medication Sig Start Date End Date Taking? Authorizing Provider  albuterol (VENTOLIN HFA) 108 (90 Base) MCG/ACT inhaler Inhale 2 puffs into the lungs every 6 (six) hours as needed for wheezing or shortness of breath. 07/25/20  Yes Mayers, Cari S, PA-C  cetirizine (ZYRTEC ALLERGY) 10 MG tablet Take 1 tablet (10 mg total) by mouth daily. 07/25/20  Yes Mayers, Cari S, PA-C  SUMAtriptan (IMITREX) 25 MG tablet Take 25 mg (1  tablet total) by mouth at the start of the headache. May repeat in 2 hours x 1 if headache persists. Max of 2 tabs/24 hours 09/26/20  Yes Zonia Kief, Amy J, NP  meloxicam (MOBIC) 15 MG tablet Take 1 tablet (15 mg total) by mouth daily. Patient not taking: Reported on 11/29/2020 03/25/20   Rushie Chestnut, PA-C  Vitamin D, Ergocalciferol, (DRISDOL) 1.25 MG (50000 UNIT) CAPS capsule Take 1 capsule (50,000 Units total) by mouth every 7 (seven) days. Patient not taking: Reported on 11/29/2020 07/26/20   Mayers, Kasandra Knudsen, PA-C     Allergies Patient has no known allergies.  Family History  Problem Relation Age of Onset   Diabetes Mother     Social History Social History   Tobacco Use   Smoking status: Never   Smokeless tobacco: Never  Vaping Use   Vaping Use: Never used  Substance Use Topics   Alcohol use: Yes    Alcohol/week: 3.0 standard drinks    Types: 3 Glasses of wine per week   Drug use: Never    Review of Systems Constitutional: No fever/chills Eyes: No visual changes. ENT: No sore throat. Cardiovascular: See HPI Respiratory: Denies shortness of breath. Gastrointestinal: No abdominal pain.   Musculoskeletal: Negative for back pain. Skin: Negative for rash. Neurological: Negative for headaches, areas of focal weakness or numbness.    ____________________________________________   PHYSICAL EXAM:  VITAL SIGNS: ED Triage Vitals  Enc Vitals Group     BP 11/29/20 1000 (!) 127/93     Pulse Rate 11/29/20 1000 98     Resp 11/29/20 1000 16     Temp 11/29/20 1000 98.5 F (36.9 C)     Temp Source 11/29/20 1000 Oral     SpO2 11/29/20 1000 100 %     Weight 11/29/20 0956 208 lb (94.3 kg)     Height 11/29/20 0956 5\' 3"  (1.6 m)     Head Circumference --      Peak Flow --      Pain Score 11/29/20 0956 7     Pain Loc --      Pain Edu? --      Excl. in GC? --     Constitutional: Alert and oriented. Well appearing and in no acute distress. Eyes: Conjunctivae are normal. Head: Atraumatic. Nose: No congestion/rhinnorhea. Mouth/Throat: Mucous membranes are moist. Neck: No stridor.  Cardiovascular: Normal rate, regular rhythm. Grossly normal heart sounds.  Good peripheral circulation. Respiratory: Normal respiratory effort.  No retractions. Lungs CTAB.  Exhibits a pleuritic type pain with deep inspiration over the mid to left chest. Gastrointestinal: Soft and nontender. No distention. Musculoskeletal: No lower extremity tenderness nor edema. Neurologic:  Normal speech and language. No  gross focal neurologic deficits are appreciated.  Skin:  Skin is warm, dry and intact. No rash noted. Psychiatric: Mood and affect are normal. Speech and behavior are normal.  ____________________________________________   LABS (all labs ordered are listed, but only abnormal results are displayed)  Labs Reviewed  CBC - Abnormal; Notable for the following components:      Result Value   MCH 24.8 (*)    RDW 15.6 (*)    All other components within normal limits  RESP PANEL BY RT-PCR (FLU A&B, COVID) ARPGX2  BASIC METABOLIC PANEL  D-DIMER, QUANTITATIVE  POC URINE PREG, ED  TROPONIN I (HIGH SENSITIVITY)  TROPONIN I (HIGH SENSITIVITY)   ____________________________________________  EKG  Reviewed interpreted by me at 10 AM Heart rate 99 QRS 89 QTc  439 normal sinus rhythm, left ventricular hypertrophy.  No evidence of acute ischemia or ectopy ____________________________________________  RADIOLOGY  DG Chest 2 View  Result Date: 11/29/2020 CLINICAL DATA:  Chest pain. Additional history provided: Patient reports central chest pain, shortness of breath, headache. EXAM: CHEST - 2 VIEW COMPARISON:  No pertinent prior exams available for comparison. FINDINGS: Heart size within normal limits. No appreciable airspace consolidation. No evidence of pleural effusion or pneumothorax. No acute bony abnormality identified. IMPRESSION: No evidence of active cardiopulmonary disease. Electronically Signed   By: Jackey Loge D.O.   On: 11/29/2020 10:20     Chest x-ray reviewed negative for acute finding. ____________________________________________   PROCEDURES  Procedure(s) performed: None  Procedures  Critical Care performed: No  ____________________________________________   INITIAL IMPRESSION / ASSESSMENT AND PLAN / ED COURSE  Pertinent labs & imaging results that were available during my care of the patient were reviewed by me and considered in my medical decision making (see chart  for details).   Differential diagnosis includes, but is not limited to, ACS, aortic dissection, pulmonary embolism, cardiac tamponade, pneumothorax, pneumonia, pericarditis, myocarditis, GI-related causes including esophagitis/gastritis, and musculoskeletal chest wall pain.    Patient reports [abrupt onset of a moderate left-sided pleuritic chest discomfort.  Currently it is resolved but does recur when taking deep breath.  No obvious infectious symptoms no noted risk factors for pulmonary embolism either.  May be musculoskeletal, but given low risk for PE will send screening D-dimer.  Initial work-up for ACS reassuring with normal first troponin.  No associate abdominal pain.  No pain seemingly referred from any location  Low risk for PE, d-dimer low risk    HEAR Score: 1   Patient updated on reassuring test results.  Also reports she is ready to go home, she does tell me that she would like a little bit of ibuprofen for a light headache.  She does have a history of similar and reports that now that she has eaten headache should go away, similar in the past.  Not a notable concern for today.  Agreeable to follow-up with primary care, discussed careful return precautions as well as place referral to cardiology.  Patient awake alert resting comfortably no pain or distress except for very mild headache at this time which she reports is not unusual for her.  ____________________________________________   FINAL CLINICAL IMPRESSION(S) / ED DIAGNOSES  Final diagnoses:  Chest pain with low risk for cardiac etiology  Pleurisy        Note:  This document was prepared using Dragon voice recognition software and may include unintentional dictation errors       Sharyn Creamer, MD 11/29/20 1616

## 2020-11-29 NOTE — Discharge Instructions (Addendum)

## 2020-11-29 NOTE — ED Triage Notes (Signed)
Patient c/o central sharp chest pain. No radiation. Reports sob and headache associated with pain

## 2020-11-29 NOTE — ED Notes (Signed)
See triage note, pt reports centralized chest pain that started this am. Denies n/v. Reports shob that worsened this am but states always has shob Nad noted. Call bell in reach. Strether locked in lowest position

## 2020-11-29 NOTE — ED Notes (Signed)
Patient transported to X-ray 

## 2020-12-04 DIAGNOSIS — U071 COVID-19: Secondary | ICD-10-CM | POA: Diagnosis not present

## 2020-12-04 DIAGNOSIS — R0981 Nasal congestion: Secondary | ICD-10-CM | POA: Diagnosis not present

## 2020-12-04 DIAGNOSIS — M791 Myalgia, unspecified site: Secondary | ICD-10-CM | POA: Diagnosis not present

## 2020-12-04 DIAGNOSIS — R52 Pain, unspecified: Secondary | ICD-10-CM | POA: Diagnosis not present

## 2020-12-07 ENCOUNTER — Ambulatory Visit: Payer: 59 | Admitting: Family

## 2020-12-13 NOTE — Progress Notes (Signed)
Erroneous encounter

## 2020-12-17 ENCOUNTER — Encounter: Payer: 59 | Admitting: Family

## 2020-12-17 DIAGNOSIS — Z113 Encounter for screening for infections with a predominantly sexual mode of transmission: Secondary | ICD-10-CM

## 2020-12-17 DIAGNOSIS — Z124 Encounter for screening for malignant neoplasm of cervix: Secondary | ICD-10-CM

## 2020-12-25 ENCOUNTER — Ambulatory Visit: Payer: 59 | Admitting: Cardiovascular Disease

## 2021-01-14 ENCOUNTER — Ambulatory Visit: Payer: 59 | Admitting: Cardiovascular Disease

## 2021-01-14 NOTE — Progress Notes (Deleted)
Cardiology Office Note  Date:  01/14/2021   ID:  Becky Gilmore, DOB [birth year], MRN [id number]  PCP:  Rema Fendt, NP   No chief complaint on file.   HPI:  Becky Gilmore is a 40 y.o. female  Obesity Hyperlipidemia Referred to the [facility] cardiology office for episode of chest pain Seen in the emergency room November 29, 2020       PMH:   has a past medical history of Abnormal Pap smear of cervix (01/2018).  PSH:    Past Surgical History:  Procedure Laterality Date   CESAREAN SECTION     genital warts      Current Outpatient Medications  Medication Sig Dispense Refill   albuterol (VENTOLIN HFA) 108 (90 Base) MCG/ACT inhaler Inhale 2 puffs into the lungs every 6 (six) hours as needed for wheezing or shortness of breath. 8.5 g 0   cetirizine (ZYRTEC ALLERGY) 10 MG tablet Take 1 tablet (10 mg total) by mouth daily. 30 tablet 11   meloxicam (MOBIC) 15 MG tablet Take 1 tablet (15 mg total) by mouth daily. (Patient not taking: Reported on 11/29/2020) 14 tablet 0   SUMAtriptan (IMITREX) 25 MG tablet Take 25 mg (1 tablet total) by mouth at the start of the headache. May repeat in 2 hours x 1 if headache persists. Max of 2 tabs/24 hours 30 tablet 0   Vitamin D, Ergocalciferol, (DRISDOL) 1.25 MG (50000 UNIT) CAPS capsule Take 1 capsule (50,000 Units total) by mouth every 7 (seven) days. (Patient not taking: Reported on 11/29/2020) 4 capsule 2   No current facility-administered medications for this visit.     Allergies:   Patient has no known allergies.   Social History:  The patient  reports that she has never smoked. She has never used smokeless tobacco. She reports current alcohol use of about 3.0 standard drinks per week. She reports that she does not use drugs.   Family History:   family history includes Diabetes in her mother.    Review of Systems: ROS   PHYSICAL EXAM: VS:  There were no vitals taken for this visit. , BMI There is no height or weight on file to  calculate BMI. GEN: Well nourished, well developed, in no acute distress HEENT: normal Neck: no JVD, carotid bruits, or masses Cardiac: RRR; no murmurs, rubs, or gallops,no edema  Respiratory:  clear to auscultation bilaterally, normal work of breathing GI: soft, nontender, nondistended, + BS MS: no deformity or atrophy Skin: warm and dry, no rash Neuro:  Strength and sensation are intact Psych: euthymic mood, full affect    Recent Labs: 07/25/2020: TSH 2.290 11/29/2020: BUN 12; Creatinine, Ser 0.92; Hemoglobin 12.3; Platelets 395; Potassium 3.7; Sodium 135    Lipid Panel Lab Results  Component Value Date   CHOL 247 (H) 07/25/2020   HDL 67 07/25/2020   LDLCALC 159 (H) 07/25/2020   TRIG 121 07/25/2020      Wt Readings from Last 3 Encounters:  11/29/20 208 lb (94.3 kg)  09/26/20 206 lb (93.4 kg)  07/25/20 208 lb (94.3 kg)       ASSESSMENT AND PLAN:  Problem List Items Addressed This Visit   None    Disposition:   F/U  12 months   Total encounter time more than 25 minutes  Greater than 50% was spent in counseling and coordination of care with the patient    Signed, Dossie Arbour, M.D., Ph.D. [facility] [place], [place] [phone]

## 2021-01-15 ENCOUNTER — Encounter: Payer: Self-pay | Admitting: Cardiovascular Disease

## 2021-03-04 ENCOUNTER — Other Ambulatory Visit: Payer: Self-pay | Admitting: Family

## 2021-03-04 DIAGNOSIS — Z1231 Encounter for screening mammogram for malignant neoplasm of breast: Secondary | ICD-10-CM

## 2021-03-14 ENCOUNTER — Ambulatory Visit
Admission: RE | Admit: 2021-03-14 | Discharge: 2021-03-14 | Disposition: A | Discharge status: Home / Self Care | Payer: 59 | Source: Ambulatory Visit | Attending: Family | Admitting: Family

## 2021-03-14 ENCOUNTER — Other Ambulatory Visit: Payer: Self-pay

## 2021-03-14 DIAGNOSIS — Z1231 Encounter for screening mammogram for malignant neoplasm of breast: Secondary | ICD-10-CM

## 2021-03-15 ENCOUNTER — Telehealth: Payer: Self-pay | Admitting: Family

## 2021-03-15 NOTE — Telephone Encounter (Signed)
Pt asked to speak w/ PCP regarding Mammography asap please.

## 2021-03-18 ENCOUNTER — Other Ambulatory Visit: Payer: Self-pay | Admitting: Family

## 2021-03-18 DIAGNOSIS — R928 Other abnormal and inconclusive findings on diagnostic imaging of breast: Secondary | ICD-10-CM

## 2021-03-20 NOTE — Telephone Encounter (Signed)
Mammogram was not sent to provider for review, pt has transferred care new patient appt w/[facility] on 04/10/21

## 2021-03-28 ENCOUNTER — Ambulatory Visit
Admission: EM | Admit: 2021-03-28 | Discharge: 2021-03-28 | Disposition: A | Discharge status: Home / Self Care | Payer: 59 | Attending: Physician Assistant | Admitting: Physician Assistant

## 2021-03-28 ENCOUNTER — Encounter: Payer: Self-pay | Admitting: Emergency Medicine

## 2021-03-28 DIAGNOSIS — M7989 Other specified soft tissue disorders: Secondary | ICD-10-CM | POA: Diagnosis not present

## 2021-03-28 MED ORDER — DOXYCYCLINE HYCLATE 100 MG PO CAPS: 100.0000 mg | ORAL_CAPSULE | Freq: Two times a day (BID) | ORAL | 0 refills | Status: DC

## 2021-03-28 NOTE — ED Provider Notes (Signed)
[name]    CSN: [id number] Arrival date & time: 03/28/21  1902      History   Chief Complaint No chief complaint on file.   HPI Becky Gilmore is a 41 y.o. female.   Patient here today for evaluation of right thumb swelling, warmth, and tenderness that started yesterday. She denies any known injury. She has not had fever. She does not report treatment. Movement and palpation/ accidentally striking thumb worsen pain.   The history is provided by the patient.   Past Medical History:  Diagnosis Date   Abnormal Pap smear of cervix 01/2018   cytology negative, HPV positive, repeat pap in 1 year    Patient Active Problem List   Diagnosis Date Noted   Mixed hyperlipidemia 07/26/2020   Class 2 obesity due to excess calories without serious comorbidity with body mass index (BMI) of 36.0 to 36.9 in adult 07/26/2020   Cough 07/25/2020   Vitamin D deficiency 07/25/2020   SOB (shortness of breath) on exertion 07/25/2020   BMI 32.0-32.9,adult 05/11/2019   Chronic tension-type headache, not intractable 05/11/2019   Low back pain 01/04/2019   Headache 01/04/2019   Urinary frequency 01/04/2019   Abdominal pain 01/04/2019    Past Surgical History:  Procedure Laterality Date   CESAREAN SECTION     genital warts      OB History     Gravida  1   Para  1   Term      Preterm      AB      Living  1      SAB      IAB      Ectopic      Multiple      Live Births               Home Medications    Prior to Admission medications   Medication Sig Start Date End Date Taking? Authorizing Provider  doxycycline (VIBRAMYCIN) 100 MG capsule Take 1 capsule (100 mg total) by mouth 2 (two) times daily. 03/28/21  Yes Tomi Bamberger, PA-C  albuterol (VENTOLIN HFA) 108 (90 Base) MCG/ACT inhaler Inhale 2 puffs into the lungs every 6 (six) hours as needed for wheezing or shortness of breath. 07/25/20   Mayers, Cari S, PA-C  cetirizine (ZYRTEC ALLERGY) 10 MG  tablet Take 1 tablet (10 mg total) by mouth daily. 07/25/20   Mayers, Cari S, PA-C  meloxicam (MOBIC) 15 MG tablet Take 1 tablet (15 mg total) by mouth daily. Patient not taking: Reported on 11/29/2020 03/25/20   Rushie Chestnut, PA-C  SUMAtriptan (IMITREX) 25 MG tablet Take 25 mg (1 tablet total) by mouth at the start of the headache. May repeat in 2 hours x 1 if headache persists. Max of 2 tabs/24 hours 09/26/20   Rema Fendt, NP  Vitamin D, Ergocalciferol, (DRISDOL) 1.25 MG (50000 UNIT) CAPS capsule Take 1 capsule (50,000 Units total) by mouth every 7 (seven) days. Patient not taking: Reported on 11/29/2020 07/26/20   Mayers, Kasandra Knudsen, PA-C    Family History Family History  Problem Relation Age of Onset   Diabetes Mother    Breast cancer Neg Hx     Social History Social History   Tobacco Use   Smoking status: Never   Smokeless tobacco: Never  Vaping Use   Vaping Use: Never used  Substance Use Topics   Alcohol use: Yes    Alcohol/week: 3.0 standard drinks    Types:  3 Glasses of wine per week   Drug use: Never     Allergies   Patient has no known allergies.   Review of Systems Review of Systems  Constitutional:  Negative for chills and fever.  Eyes:  Negative for discharge and redness.  Gastrointestinal:  Negative for abdominal pain, nausea and vomiting.  Musculoskeletal:  Negative for arthralgias.  Skin:  Negative for color change and wound.    Physical Exam Triage Vital Signs ED Triage Vitals  Enc Vitals Group     BP 03/28/21 1910 107/70     Pulse Rate 03/28/21 1910 (!) 106     Resp 03/28/21 1910 16     Temp 03/28/21 1910 98.3 F (36.8 C)     Temp Source 03/28/21 1910 Oral     SpO2 03/28/21 1910 98 %     Weight --      Height --      Head Circumference --      Peak Flow --      Pain Score 03/28/21 1911 9     Pain Loc --      Pain Edu? --      Excl. in GC? --    No data found.  Updated Vital Signs BP 107/70 (BP Location: Left Arm)    Pulse (!) 106     Temp 98.3 F (36.8 C) (Oral)    Resp 16    SpO2 98%      Physical Exam Vitals and nursing note reviewed.  Constitutional:      General: She is not in acute distress.    Appearance: Normal appearance. She is not ill-appearing.  HENT:     Head: Normocephalic and atraumatic.  Eyes:     Conjunctiva/sclera: Conjunctivae normal.  Cardiovascular:     Rate and Rhythm: Normal rate.  Pulmonary:     Effort: Pulmonary effort is normal.  Musculoskeletal:     Comments: Diffuse swelling noted to right thumb without erythema   Neurological:     Mental Status: She is alert.  Psychiatric:        Mood and Affect: Mood normal.        Behavior: Behavior normal.        Thought Content: Thought content normal.     UC Treatments / Results  Labs (all labs ordered are listed, but only abnormal results are displayed) Labs Reviewed - No data to display  EKG   Radiology No results found.  Procedures Procedures (including critical care time)  Medications Ordered in UC Medications - No data to display  Initial Impression / Assessment and Plan / UC Course  I have reviewed the triage vital signs and the nursing notes.  Pertinent labs & imaging results that were available during my care of the patient were reviewed by me and considered in my medical decision making (see chart for details).    Will treat to cover possible infection with doxycycline. Recommended follow up if symptoms do not improve with treatment or worsen in any way.   Final Clinical Impressions(s) / UC Diagnoses   Final diagnoses:  Thumb swelling   Discharge Instructions   None    ED Prescriptions     Medication Sig Dispense Auth. Provider   doxycycline (VIBRAMYCIN) 100 MG capsule Take 1 capsule (100 mg total) by mouth 2 (two) times daily. 20 capsule Tomi Bamberger, PA-C      PDMP not reviewed this encounter.   Tomi Bamberger, PA-C 03/28/21 Ernestina [place]

## 2021-03-28 NOTE — ED Triage Notes (Signed)
Right thumb swelling, pain, warmth starting yesterday after no known injury. Swelling encompasses entire digit, stops at MCP joint. Digit warm to touch, cap refill brisk but causes significant pain. Sensation intact. Unable to move d/t swelling. Denies hx of diabetes, clotting disorder

## 2021-04-08 ENCOUNTER — Other Ambulatory Visit: Payer: Self-pay

## 2021-04-08 NOTE — Progress Notes (Signed)
? ?New Patient Office Visit ? ?Subjective:  ?Patient ID: Becky [facility]more, female    DOB: 1980-02-06  Age: 41 y.o. MRN: [id number] ? ?CC:  ?Chief Complaint  ?Patient presents with  ? Establish Care  ?  Np. Est care. Overall health assessment  ? ? ?HPI ?Emerie Gilmore presents for new patient visit to establish care.  Introduced to Publishing rights manager role and practice setting.  All questions answered.  Discussed provider/patient relationship and expectations. ? ?She has a history of low vitamin D last June 2022 and was taking vitamin D supplement for 6 weeks.  She was unsure if she was supposed to continue this supplement for another one after completing.  She does endorse some fatigue. ? ?She has a history of migraines that are well controlled.  She takes Imitrex as needed. ? ?She had a mammogram recently which came back abnormal.  Her diagnostic mammogram and ultrasound showed a probable benign mass in her left breast.  She opted for follow-up ultrasound in 6 months.  She has a scheduled for September 13. ? ? ?Past Medical History:  ?Diagnosis Date  ? Abnormal Pap smear of cervix 01/2018  ? cytology negative, HPV positive, repeat pap in 1 year  ? Migraine   ? Vitamin D deficiency   ? ? ?Past Surgical History:  ?Procedure Laterality Date  ? CESAREAN SECTION    ? COLPOSCOPY  2021  ? genital warts    ? ? ?Family History  ?Problem Relation Age of Onset  ? Diabetes Mother   ? Breast cancer Neg Hx   ? ? ?Social History  ? ?Socioeconomic History  ? Marital status: Single  ?  Spouse name: Not on file  ? Number of children: Not on file  ? Years of education: Not on file  ? Highest education level: Not on file  ?Occupational History  ? Not on file  ?Tobacco Use  ? Smoking status: Never  ? Smokeless tobacco: Never  ?Vaping Use  ? Vaping Use: Never used  ?Substance and Sexual Activity  ? Alcohol use: Yes  ?  Alcohol/week: 4.0 standard drinks  ?  Types: 4 Glasses of wine per week  ? Drug use: Never  ? Sexual activity: Yes  ?   Partners: Male  ?  Birth control/protection: Condom  ?Other Topics Concern  ? Not on file  ?Social History Narrative  ? Not on file  ? ?Social Determinants of Health  ? ?Financial Resource Strain: Not on file  ?Food Insecurity: Not on file  ?Transportation Needs: Not on file  ?Physical Activity: Not on file  ?Stress: Not on file  ?Social Connections: Not on file  ?Intimate Partner Violence: Not on file  ? ? ?ROS ?Review of Systems  ?Constitutional:  Positive for fatigue.  ?HENT: Negative.    ?Eyes: Negative.   ?Respiratory:  Positive for shortness of breath (intermittent, chronic).   ?Cardiovascular: Negative.   ?Gastrointestinal: Negative.   ?Endocrine: Positive for polyuria.  ?Genitourinary: Negative.   ?Musculoskeletal: Negative.   ?Skin: Negative.   ?Neurological: Negative.   ?Psychiatric/Behavioral: Negative.    ? ?Objective:  ? ?Today's Vitals: BP 128/84   Pulse 98   Temp 97.9 ?F (36.6 ?C) (Temporal)   Ht 5\' 2"  (1.575 m)   Wt 219 lb (99.3 kg)   SpO2 99%   BMI 40.06 kg/m?  ? ?Physical Exam ?Vitals and nursing note reviewed.  ?Constitutional:   ?   General: She is not in acute distress. ?   Appearance:  Normal appearance.  ?HENT:  ?   Head: Normocephalic and atraumatic.  ?   Right Ear: Tympanic membrane, ear canal and external ear normal.  ?   Left Ear: Tympanic membrane, ear canal and external ear normal.  ?   Nose: Nose normal.  ?   Mouth/Throat:  ?   Mouth: Mucous membranes are moist.  ?   Pharynx: Oropharynx is clear.  ?Eyes:  ?   Conjunctiva/sclera: Conjunctivae normal.  ?Cardiovascular:  ?   Rate and Rhythm: Normal rate and regular rhythm.  ?   Pulses: Normal pulses.  ?   Heart sounds: Normal heart sounds.  ?Pulmonary:  ?   Effort: Pulmonary effort is normal.  ?   Breath sounds: Normal breath sounds.  ?Abdominal:  ?   General: Bowel sounds are normal.  ?   Palpations: Abdomen is soft.  ?   Tenderness: There is no abdominal tenderness.  ?Musculoskeletal:     ?   General: Normal range of motion.  ?    Cervical back: Normal range of motion. No tenderness.  ?Lymphadenopathy:  ?   Cervical: No cervical adenopathy.  ?Skin: ?   General: Skin is warm and dry.  ?Neurological:  ?   General: No focal deficit present.  ?   Mental Status: She is alert and oriented to person, place, and time.  ?Psychiatric:     ?   Mood and Affect: Mood normal.     ?   Behavior: Behavior normal.     ?   Thought Content: Thought content normal.     ?   Judgment: Judgment normal.  ? ? ?Assessment & Plan:  ? ?Problem List Items Addressed This Visit   ? ?  ? Cardiovascular and Mediastinum  ? Migraine without aura and without status migrainosus, not intractable  ?  Chronic, stable.  Continue Imitrex as needed for migraines.  She does not need refill at this time.  Follow-up with any concerns. ?  ?  ?  ? Other  ? Vitamin D deficiency - Primary  ?  History of low vitamin D, was 17 on 07/2020.  She was given vitamin D supplements however stopped them after 6 weeks when she ran out.  We will check vitamin D levels today and treat based on results ?  ?  ? Relevant Orders  ? VITAMIN D 25 Hydroxy (Vit-D Deficiency, Fractures)  ? Hyperlipidemia  ?  She has a history of elevated cholesterol.  We will recheck lipid panel today and treat based on results.  Discussed limiting fried/fatty foods and eating more fruits and vegetables. ?  ?  ? Relevant Orders  ? CBC  ? Comprehensive metabolic panel  ? Lipid panel  ? Morbid obesity (HCC)  ?  BMI is 40 today.  Discussed diet and exercise.  Goal is to lose 1 to 2 pounds per week. ?  ?  ? Relevant Orders  ? Hemoglobin A1c  ? ?Other Visit Diagnoses   ? ? Chronic fatigue      ? Endorses fatigue, we will check CBC, TSH, A1c, and vitamin D.  Follow-up based on results  ? Relevant Orders  ? Hemoglobin A1c  ? TSH  ? Screening for HIV (human immunodeficiency virus)      ? Screen for HIV today  ? Relevant Orders  ? HIV Antibody (routine testing w rflx)  ? ?  ? ? ?Outpatient Encounter Medications as of 04/10/2021  ?Medication  Sig  ? albuterol (VENTOLIN HFA)  108 (90 Base) MCG/ACT inhaler Inhale 2 puffs into the lungs every 6 (six) hours as needed for wheezing or shortness of breath.  ? cetirizine (ZYRTEC ALLERGY) 10 MG tablet Take 1 tablet (10 mg total) by mouth daily.  ? SUMAtriptan (IMITREX) 25 MG tablet Take 25 mg (1 tablet total) by mouth at the start of the headache. May repeat in 2 hours x 1 if headache persists. Max of 2 tabs/24 hours  ? [DISCONTINUED] doxycycline (VIBRAMYCIN) 100 MG capsule Take 1 capsule (100 mg total) by mouth 2 (two) times daily.  ? [DISCONTINUED] meloxicam (MOBIC) 15 MG tablet Take 1 tablet (15 mg total) by mouth daily. (Patient not taking: Reported on 11/29/2020)  ? [DISCONTINUED] Vitamin D, Ergocalciferol, (DRISDOL) 1.25 MG (50000 UNIT) CAPS capsule Take 1 capsule (50,000 Units total) by mouth every 7 (seven) days. (Patient not taking: Reported on 11/29/2020)  ? ?No facility-administered encounter medications on file as of 04/10/2021.  ? ? ?Follow-up: Return in about 4 months (around 08/10/2021) for CPE.  ? ?A total of 30 minutes were spent on this encounter today. When total time is documented, this includes both the face-to-face and non-face-to-face time personally spent before, during and after the visit on the date of the encounter. ? ?Gerre Scull, NP ? ?

## 2021-04-10 ENCOUNTER — Ambulatory Visit
Admission: RE | Admit: 2021-04-10 | Discharge: 2021-04-10 | Disposition: A | Discharge status: Home / Self Care | Payer: 59 | Source: Ambulatory Visit | Attending: Family | Admitting: Family

## 2021-04-10 ENCOUNTER — Ambulatory Visit (INDEPENDENT_AMBULATORY_CARE_PROVIDER_SITE_OTHER): Payer: 59 | Admitting: Nurse Practitioner

## 2021-04-10 ENCOUNTER — Other Ambulatory Visit: Payer: Self-pay

## 2021-04-10 ENCOUNTER — Other Ambulatory Visit: Payer: Self-pay | Admitting: Family

## 2021-04-10 ENCOUNTER — Encounter: Payer: Self-pay | Admitting: Nurse Practitioner

## 2021-04-10 VITALS — BP 128/84 | HR 98 | Temp 97.9°F | Ht 62.0 in | Wt 219.0 lb

## 2021-04-10 DIAGNOSIS — G43009 Migraine without aura, not intractable, without status migrainosus: Secondary | ICD-10-CM | POA: Diagnosis not present

## 2021-04-10 DIAGNOSIS — E785 Hyperlipidemia, unspecified: Secondary | ICD-10-CM | POA: Diagnosis not present

## 2021-04-10 DIAGNOSIS — R5382 Chronic fatigue, unspecified: Secondary | ICD-10-CM | POA: Diagnosis not present

## 2021-04-10 DIAGNOSIS — R928 Other abnormal and inconclusive findings on diagnostic imaging of breast: Secondary | ICD-10-CM

## 2021-04-10 DIAGNOSIS — E559 Vitamin D deficiency, unspecified: Secondary | ICD-10-CM | POA: Diagnosis not present

## 2021-04-10 DIAGNOSIS — N6489 Other specified disorders of breast: Secondary | ICD-10-CM | POA: Diagnosis not present

## 2021-04-10 DIAGNOSIS — G43709 Chronic migraine without aura, not intractable, without status migrainosus: Secondary | ICD-10-CM | POA: Insufficient documentation

## 2021-04-10 DIAGNOSIS — N6323 Unspecified lump in the left breast, lower outer quadrant: Secondary | ICD-10-CM | POA: Diagnosis not present

## 2021-04-10 DIAGNOSIS — R922 Inconclusive mammogram: Secondary | ICD-10-CM | POA: Diagnosis not present

## 2021-04-10 DIAGNOSIS — N6320 Unspecified lump in the left breast, unspecified quadrant: Secondary | ICD-10-CM

## 2021-04-10 DIAGNOSIS — Z114 Encounter for screening for human immunodeficiency virus [HIV]: Secondary | ICD-10-CM | POA: Diagnosis not present

## 2021-04-10 NOTE — Assessment & Plan Note (Signed)
BMI is 40 today.  Discussed diet and exercise.  Goal is to lose 1 to 2 pounds per week. ?

## 2021-04-10 NOTE — Assessment & Plan Note (Signed)
She has a history of elevated cholesterol.  We will recheck lipid panel today and treat based on results.  Discussed limiting fried/fatty foods and eating more fruits and vegetables. ?

## 2021-04-10 NOTE — Patient Instructions (Signed)
It was great to see you! ? ?We are checking your labs today and will send the results to mychart/call if needed. ? ?Let's follow-up in 4 months, sooner if you have concerns. ? ?If a referral was placed today, you will be contacted for an appointment. Please note that routine referrals can sometimes take up to 3-4 weeks to process. Please call our office if you haven't heard anything after this time frame. ? ?Take care, ? ?Rodman Pickle, NP ? ?

## 2021-04-10 NOTE — Assessment & Plan Note (Signed)
History of low vitamin D, was 17 on 07/2020.  She was given vitamin D supplements however stopped them after 6 weeks when she ran out.  We will check vitamin D levels today and treat based on results ?

## 2021-04-10 NOTE — Assessment & Plan Note (Signed)
Chronic, stable.  Continue Imitrex as needed for migraines.  She does not need refill at this time.  Follow-up with any concerns. ?

## 2021-04-15 ENCOUNTER — Ambulatory Visit: Payer: 59 | Admitting: Nurse Practitioner

## 2021-04-17 ENCOUNTER — Ambulatory Visit (INDEPENDENT_AMBULATORY_CARE_PROVIDER_SITE_OTHER): Payer: 59 | Admitting: Nurse Practitioner

## 2021-04-17 ENCOUNTER — Other Ambulatory Visit (HOSPITAL_COMMUNITY)
Admission: RE | Admit: 2021-04-17 | Discharge: 2021-04-17 | Disposition: A | Discharge status: Home / Self Care | Payer: 59 | Source: Ambulatory Visit | Attending: Nurse Practitioner | Admitting: Nurse Practitioner

## 2021-04-17 ENCOUNTER — Encounter: Payer: Self-pay | Admitting: Nurse Practitioner

## 2021-04-17 ENCOUNTER — Other Ambulatory Visit: Payer: Self-pay

## 2021-04-17 VITALS — BP 123/78 | HR 106 | Temp 97.6°F | Ht 62.0 in | Wt 211.4 lb

## 2021-04-17 DIAGNOSIS — R87618 Other abnormal cytological findings on specimens from cervix uteri: Secondary | ICD-10-CM | POA: Insufficient documentation

## 2021-04-17 DIAGNOSIS — R5382 Chronic fatigue, unspecified: Secondary | ICD-10-CM

## 2021-04-17 DIAGNOSIS — E785 Hyperlipidemia, unspecified: Secondary | ICD-10-CM

## 2021-04-17 DIAGNOSIS — Z114 Encounter for screening for human immunodeficiency virus [HIV]: Secondary | ICD-10-CM | POA: Diagnosis not present

## 2021-04-17 DIAGNOSIS — E559 Vitamin D deficiency, unspecified: Secondary | ICD-10-CM

## 2021-04-17 LAB — COMPREHENSIVE METABOLIC PANEL
ALT: 10 U/L (ref 0–35)
AST: 14 U/L (ref 0–37)
Albumin: 4.2 g/dL (ref 3.5–5.2)
Alkaline Phosphatase: 96 U/L (ref 39–117)
BUN: 12 mg/dL (ref 6–23)
CO2: 27 mEq/L (ref 19–32)
Calcium: 9.1 mg/dL (ref 8.4–10.5)
Chloride: 101 mEq/L (ref 96–112)
Creatinine, Ser: 0.86 mg/dL (ref 0.40–1.20)
GFR: 84.58 mL/min (ref >60.00)
Glucose, Bld: 96 mg/dL (ref 70–99)
Potassium: 3.7 mEq/L (ref 3.5–5.1)
Sodium: 135 mEq/L (ref 135–145)
Total Bilirubin: 0.4 mg/dL (ref 0.2–1.2)
Total Protein: 7 g/dL (ref 6.0–8.3)

## 2021-04-17 LAB — CBC
HCT: 38.8 % (ref 36.0–46.0)
Hemoglobin: 12.4 g/dL (ref 12.0–15.0)
MCHC: 31.9 g/dL (ref 30.0–36.0)
MCV: 77.9 fl — ABNORMAL LOW (ref 78.0–100.0)
Platelets: 396 10*3/uL (ref 150.0–400.0)
RBC: 4.98 Mil/uL (ref 3.87–5.11)
RDW: 16.7 % — ABNORMAL HIGH (ref 11.5–15.5)
WBC: 9.2 10*3/uL (ref 4.0–10.5)

## 2021-04-17 LAB — LIPID PANEL
Cholesterol: 243 mg/dL — ABNORMAL HIGH (ref 0–200)
HDL: 54.4 mg/dL (ref >39.00)
LDL Cholesterol: 152 mg/dL — ABNORMAL HIGH (ref 0–99)
NonHDL: 188.54
Total CHOL/HDL Ratio: 4
Triglycerides: 182 mg/dL — ABNORMAL HIGH (ref 0.0–149.0)
VLDL: 36.4 mg/dL (ref 0.0–40.0)

## 2021-04-17 LAB — TSH: TSH: 3.24 u[IU]/mL (ref 0.35–5.50)

## 2021-04-17 LAB — HEMOGLOBIN A1C: Hgb A1c MFr Bld: 5.8 % (ref 4.6–6.5)

## 2021-04-17 LAB — VITAMIN D 25 HYDROXY (VIT D DEFICIENCY, FRACTURES): VITD: 15.13 ng/mL — ABNORMAL LOW (ref 30.00–100.00)

## 2021-04-17 NOTE — Assessment & Plan Note (Signed)
She has lost 8 pounds since last visit, congratulated her on this. ?

## 2021-04-17 NOTE — Assessment & Plan Note (Signed)
Labs were not able to be drawn last visit, will check vitamin D today ?

## 2021-04-17 NOTE — Assessment & Plan Note (Signed)
Labs not able to be drawn last visit we will check lipid panel today ?

## 2021-04-17 NOTE — Patient Instructions (Signed)
It was great to see you! ? ?Send me any paperwork you need me to fill out. If you have specific dates, please let me know.  ? ?Let's follow-up in 4 months, sooner if you have concerns. ? ?If a referral was placed today, you will be contacted for an appointment. Please note that routine referrals can sometimes take up to 3-4 weeks to process. Please call our office if you haven't heard anything after this time frame. ? ?Take care, ? ?Vance Peper, NP ? ?

## 2021-04-17 NOTE — Progress Notes (Signed)
? ?Established Patient Office Visit ? ?Subjective:  ?Patient ID: Becky Gilmore, female    DOB: 1980/10/26  Age: 41 y.o. MRN: [id number] ? ?CC:  ?Chief Complaint  ?Patient presents with  ? Follow-up  ?  Pt is here for pap and lab work  ? ? ?HPI ?Becky Gilmore presents for for pap. She also has questions about FMLA to care for her son.   ? ?Her son has been having some health issues. She may need to miss work to help care for him. She is requesting FMLA paper work to be filled out. Her sons healthcare provider wouldn't complete the FMLA paper work for her.  ? ?She has a history of abnormal pap smear in the past. Prior pap smears on 05/11/19 and 01/08/18 showed HPV and negative cervical changes.  On 06/17/2019 she had a colposcopy which showed CIN 1 and high risk HPV.  She was told to have another Pap in 1 year.  She denies any abnormal vaginal bleeding, abdominal pain, urinary symptoms. ? ? ?Past Medical History:  ?Diagnosis Date  ? Abnormal Pap smear of cervix 01/2018  ? cytology negative, HPV positive, repeat pap in 1 year  ? Migraine   ? Vitamin D deficiency   ? ? ?Past Surgical History:  ?Procedure Laterality Date  ? CESAREAN SECTION    ? COLPOSCOPY  2021  ? genital warts    ? ? ?Family History  ?Problem Relation Age of Onset  ? Diabetes Mother   ? Breast cancer Neg Hx   ? ? ?Social History  ? ?Socioeconomic History  ? Marital status: Single  ?  Spouse name: Not on file  ? Number of children: Not on file  ? Years of education: Not on file  ? Highest education level: Not on file  ?Occupational History  ? Not on file  ?Tobacco Use  ? Smoking status: Never  ? Smokeless tobacco: Never  ?Vaping Use  ? Vaping Use: Never used  ?Substance and Sexual Activity  ? Alcohol use: Yes  ?  Alcohol/week: 4.0 standard drinks  ?  Types: 4 Glasses of wine per week  ? Drug use: Never  ? Sexual activity: Yes  ?  Partners: Male  ?  Birth control/protection: Condom  ?Other Topics Concern  ? Not on file  ?Social History Narrative  ? Not  on file  ? ?Social Determinants of Health  ? ?Financial Resource Strain: Not on file  ?Food Insecurity: Not on file  ?Transportation Needs: Not on file  ?Physical Activity: Not on file  ?Stress: Not on file  ?Social Connections: Not on file  ?Intimate Partner Violence: Not on file  ? ? ?Outpatient Medications Prior to Visit  ?Medication Sig Dispense Refill  ? albuterol (VENTOLIN HFA) 108 (90 Base) MCG/ACT inhaler Inhale 2 puffs into the lungs every 6 (six) hours as needed for wheezing or shortness of breath. 8.5 g 0  ? cetirizine (ZYRTEC ALLERGY) 10 MG tablet Take 1 tablet (10 mg total) by mouth daily. 30 tablet 11  ? SUMAtriptan (IMITREX) 25 MG tablet Take 25 mg (1 tablet total) by mouth at the start of the headache. May repeat in 2 hours x 1 if headache persists. Max of 2 tabs/24 hours 30 tablet 0  ? ?No facility-administered medications prior to visit.  ? ? ?No Known Allergies ? ?ROS ?Review of Systems ?See pertinent positives and negatives per HPI. ? ?  ?Objective:  ?  ?Physical Exam ?Vitals and nursing note reviewed. Exam conducted  with a chaperone present.  ?Constitutional:   ?   General: She is not in acute distress. ?   Appearance: Normal appearance.  ?HENT:  ?   Head: Normocephalic and atraumatic.  ?Eyes:  ?   Conjunctiva/sclera: Conjunctivae normal.  ?Cardiovascular:  ?   Rate and Rhythm: Normal rate.  ?Pulmonary:  ?   Effort: Pulmonary effort is normal.  ?Chest:  ?   Comments: Deferred per patient request ?Genitourinary: ?   General: Normal vulva.  ?   Exam position: Lithotomy position.  ?   Labia:     ?   Right: No rash, tenderness or lesion.     ?   Left: No rash, tenderness or lesion.   ?   Vagina: Normal.  ?   Cervix: Normal.  ?   Uterus: Normal.   ?   Adnexa: Right adnexa normal and left adnexa normal.  ?Musculoskeletal:  ?   Cervical back: Normal range of motion.  ?Skin: ?   General: Skin is warm and dry.  ?Neurological:  ?   General: No focal deficit present.  ?   Mental Status: She is alert and  oriented to person, place, and time.  ?Psychiatric:     ?   Mood and Affect: Mood normal.     ?   Behavior: Behavior normal.     ?   Thought Content: Thought content normal.     ?   Judgment: Judgment normal.  ? ? ?BP 123/78 (BP Location: Left Arm, Patient Position: Sitting, Cuff Size: Normal)   Pulse (!) 106   Temp 97.6 ?F (36.4 ?C) (Temporal)   Ht '5\' 2"'  (1.575 m)   Wt 211 lb 6.4 oz (95.9 kg)   SpO2 100%   BMI 38.67 kg/m?  ?Wt Readings from Last 3 Encounters:  ?04/17/21 211 lb 6.4 oz (95.9 kg)  ?04/10/21 219 lb (99.3 kg)  ?11/29/20 208 lb (94.3 kg)  ? ? ? ?Health Maintenance Due  ?Topic Date Due  ? COVID-19 Vaccine (2 - Pfizer series) 10/31/2019  ? ? ?There are no preventive care reminders to display for this patient. ? ?Lab Results  ?Component Value Date  ? TSH 2.290 07/25/2020  ? ?Lab Results  ?Component Value Date  ? WBC 8.9 11/29/2020  ? HGB 12.3 11/29/2020  ? HCT 39.8 11/29/2020  ? MCV 80.2 11/29/2020  ? PLT 395 11/29/2020  ? ?Lab Results  ?Component Value Date  ? NA 135 11/29/2020  ? K 3.7 11/29/2020  ? CO2 26 11/29/2020  ? GLUCOSE 93 11/29/2020  ? BUN 12 11/29/2020  ? CREATININE 0.92 11/29/2020  ? BILITOT 0.3 07/25/2020  ? ALKPHOS 91 07/25/2020  ? AST 16 07/25/2020  ? ALT 13 02/01/2018  ? PROT 7.1 07/25/2020  ? ALBUMIN 4.2 07/25/2020  ? CALCIUM 8.9 11/29/2020  ? ANIONGAP 8 11/29/2020  ? EGFR 99 07/25/2020  ? GFR 94.17 02/01/2018  ? ?Lab Results  ?Component Value Date  ? CHOL 247 (H) 07/25/2020  ? ?Lab Results  ?Component Value Date  ? HDL 67 07/25/2020  ? ?Lab Results  ?Component Value Date  ? LDLCALC 159 (H) 07/25/2020  ? ?Lab Results  ?Component Value Date  ? TRIG 121 07/25/2020  ? ?Lab Results  ?Component Value Date  ? CHOLHDL 3.7 07/25/2020  ? ?Lab Results  ?Component Value Date  ? HGBA1C 5.5 05/11/2019  ? ? ?  ?Assessment & Plan:  ? ?Problem List Items Addressed This Visit   ? ?  ?  Other  ? Vitamin D deficiency  ?  Labs were not able to be drawn last visit, will check vitamin D today ?  ?  ?  Hyperlipidemia  ?  Labs not able to be drawn last visit we will check lipid panel today ?  ?  ? Morbid obesity ([facility])  ?  She has lost 8 pounds since last visit, congratulated her on this. ?  ?  ? Abnormal Papanicolaou smear of cervix with positive human papilloma virus (HPV) test - Primary  ?  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  Pap done today.  Will await results and refer to GYN if needed. ?  ?  ? Relevant Orders  ? Cytology - PAP  ? ?Other Visit Diagnoses   ? ? Chronic fatigue      ? Endorses fatigue, we will check CBC, TSH, A1c, and vitamin D.  Follow-up based on results  ? Screening for HIV (human immunodeficiency virus)      ? Screen for HIV today  ? ?  ? ? ?No orders of the defined types were placed in this encounter. ? ? ?Follow-up: Return in about 4 months (around 08/17/2021) for CPE after 07/26/21.  ? ? ?Charyl Dancer, NP ?

## 2021-04-17 NOTE — Assessment & Plan Note (Signed)
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  Pap done today.  Will await results and refer to GYN if needed. ?

## 2021-04-18 ENCOUNTER — Other Ambulatory Visit (HOSPITAL_COMMUNITY): Payer: Self-pay

## 2021-04-18 ENCOUNTER — Encounter: Payer: Self-pay | Admitting: Nurse Practitioner

## 2021-04-18 ENCOUNTER — Telehealth: Payer: Self-pay | Admitting: Nurse Practitioner

## 2021-04-18 LAB — HIV ANTIBODY (ROUTINE TESTING W REFLEX): HIV 1&2 Ab, 4th Generation: NONREACTIVE

## 2021-04-18 NOTE — Telephone Encounter (Signed)
Pt asked what pharmacy her Vit D was called into... I did not see the Vit D on med list.  ?

## 2021-04-18 NOTE — Telephone Encounter (Signed)
We received a fmla form through fax for pt. I attached a charge form and put in providers folder. Pt has not signed charge form ?

## 2021-04-18 NOTE — Telephone Encounter (Signed)
Paperwork received on @03 /16/2023@  ? ?Type of paperwork: FMLA ? ?Route received: Fax ? ? ?Has patient completed their portion of the paperwork: No ? ? ?Has office staff updated demographics of paperwork: yes ? ? ?Paperwork routed to provider : Placed on provider desk ? ? ? ?  ?

## 2021-04-19 ENCOUNTER — Other Ambulatory Visit (HOSPITAL_COMMUNITY): Payer: Self-pay

## 2021-04-19 LAB — CYTOLOGY - PAP
Adequacy: ABSENT
Comment: NEGATIVE
Diagnosis: UNDETERMINED — AB
High risk HPV: NEGATIVE

## 2021-04-19 MED ORDER — VITAMIN D (ERGOCALCIFEROL) 1.25 MG (50000 UNIT) PO CAPS
50000.0000 [IU] | ORAL_CAPSULE | ORAL | 0 refills | Status: DC
  Filled 2021-04-19: qty 12, 84d supply, fill #0

## 2021-04-22 ENCOUNTER — Other Ambulatory Visit (HOSPITAL_COMMUNITY): Payer: Self-pay

## 2021-04-22 NOTE — Telephone Encounter (Signed)
Noted  

## 2021-04-22 NOTE — Telephone Encounter (Signed)
Sent a mychart message to patient with provider instructions and comments.  ?

## 2021-04-23 NOTE — Telephone Encounter (Signed)
PAPERWORK COMPLETE AND FAXED TO [employer].  ?

## 2021-07-29 ENCOUNTER — Encounter: Payer: 59 | Admitting: Nurse Practitioner

## 2021-10-14 ENCOUNTER — Ambulatory Visit
Admission: RE | Admit: 2021-10-14 | Discharge: 2021-10-14 | Disposition: A | Discharge status: Home / Self Care | Payer: Medicaid Other | Source: Ambulatory Visit | Attending: Physician Assistant | Admitting: Physician Assistant

## 2021-10-14 VITALS — BP 115/80 | HR 102 | Temp 97.6°F | Resp 16

## 2021-10-14 DIAGNOSIS — R112 Nausea with vomiting, unspecified: Secondary | ICD-10-CM

## 2021-10-14 MED ORDER — ONDANSETRON 4 MG PO TBDP: 4.0000 mg | ORAL_TABLET | Freq: Three times a day (TID) | ORAL | 0 refills | Status: DC | PRN

## 2021-10-14 NOTE — ED Provider Notes (Signed)
[name]    CSN: [id number] Arrival date & time: 10/14/21  1344      History   Chief Complaint Chief Complaint  Patient presents with   Nausea    Entered by patient    HPI Becky Gilmore is a 41 y.o. female.   Patient here today for evaluation of nausea and vomiting that started this morning.  She reports she has vomited 3 times since onset of symptoms.  She denies any diarrhea.  She has not had any abdominal pain.  She denies any cough or congestion.  She has not had fever.  She does not report treatment for symptoms.  The history is provided by the patient.    Past Medical History:  Diagnosis Date   Abnormal Pap smear of cervix 01/2018   cytology negative, HPV positive, repeat pap in 1 year   Migraine    Vitamin D deficiency     Patient Active Problem List   Diagnosis Date Noted   Abnormal Papanicolaou smear of cervix with positive human papilloma virus (HPV) test 04/17/2021   Migraine without aura and without status migrainosus, not intractable 04/10/2021   Hyperlipidemia 07/26/2020   Morbid obesity (HCC) 07/26/2020   Vitamin D deficiency 07/25/2020   SOB (shortness of breath) on exertion 07/25/2020    Past Surgical History:  Procedure Laterality Date   CESAREAN SECTION     COLPOSCOPY  2021   genital warts      OB History     Gravida  1   Para  1   Term      Preterm      AB      Living  1      SAB      IAB      Ectopic      Multiple      Live Births               Home Medications    Prior to Admission medications   Medication Sig Start Date End Date Taking? Authorizing Provider  ondansetron (ZOFRAN-ODT) 4 MG disintegrating tablet Take 1 tablet (4 mg total) by mouth every 8 (eight) hours as needed. 10/14/21  Yes Tomi Bamberger, PA-C  albuterol (VENTOLIN HFA) 108 (90 Base) MCG/ACT inhaler Inhale 2 puffs into the lungs every 6 (six) hours as needed for wheezing or shortness of breath. 07/25/20   Mayers, Cari S,  PA-C  cetirizine (ZYRTEC ALLERGY) 10 MG tablet Take 1 tablet (10 mg total) by mouth daily. 07/25/20   Mayers, Cari S, PA-C  SUMAtriptan (IMITREX) 25 MG tablet Take 25 mg (1 tablet total) by mouth at the start of the headache. May repeat in 2 hours x 1 if headache persists. Max of 2 tabs/24 hours 09/26/20   Rema Fendt, NP  Vitamin D, Ergocalciferol, (DRISDOL) 1.25 MG (50000 UNIT) CAPS capsule Take 1 capsule (50,000 Units total) by mouth every 7 (seven) days. 04/19/21   McElwee, Jake Church, NP    Family History Family History  Problem Relation Age of Onset   Diabetes Mother    Breast cancer Neg Hx     Social History Social History   Tobacco Use   Smoking status: Never   Smokeless tobacco: Never  Vaping Use   Vaping Use: Never used  Substance Use Topics   Alcohol use: Yes    Alcohol/week: 4.0 standard drinks of alcohol    Types: 4 Glasses of wine per week   Drug use: Never  Allergies   Patient has no known allergies.   Review of Systems Review of Systems  Constitutional:  Negative for chills and fever.  HENT:  Negative for congestion and sore throat.   Eyes:  Negative for discharge and redness.  Respiratory:  Negative for cough and shortness of breath.   Gastrointestinal:  Positive for nausea and vomiting. Negative for abdominal pain and diarrhea.     Physical Exam Triage Vital Signs ED Triage Vitals  Enc Vitals Group     BP 10/14/21 1431 115/80     Pulse Rate 10/14/21 1431 (!) 102     Resp 10/14/21 1431 16     Temp 10/14/21 1431 97.6 F (36.4 C)     Temp Source 10/14/21 1431 Oral     SpO2 10/14/21 1431 96 %     Weight --      Height --      Head Circumference --      Peak Flow --      Pain Score 10/14/21 1449 0     Pain Loc --      Pain Edu? --      Excl. in [place]? --    No data found.  Updated Vital Signs BP 115/80 (BP Location: Left Arm)   Pulse (!) 102   Temp 97.6 F (36.4 C) (Oral)   Resp 16   LMP  (LMP Unknown)   SpO2 96%      Physical  Exam Vitals and nursing note reviewed.  Constitutional:      General: She is not in acute distress.    Appearance: Normal appearance. She is not ill-appearing.  HENT:     Head: Normocephalic and atraumatic.  Eyes:     Conjunctiva/sclera: Conjunctivae normal.  Cardiovascular:     Rate and Rhythm: Normal rate and regular rhythm.     Heart sounds: Normal heart sounds. No murmur heard. Pulmonary:     Effort: Pulmonary effort is normal. No respiratory distress.     Breath sounds: Normal breath sounds. No wheezing, rhonchi or rales.  Abdominal:     General: Abdomen is flat. Bowel sounds are normal. There is no distension.     Palpations: Abdomen is soft.     Tenderness: There is no abdominal tenderness. There is no guarding or rebound.  Neurological:     Mental Status: She is alert.  Psychiatric:        Mood and Affect: Mood normal.        Behavior: Behavior normal.        Thought Content: Thought content normal.      UC Treatments / Results  Labs (all labs ordered are listed, but only abnormal results are displayed) Labs Reviewed - No data to display  EKG   Radiology No results found.  Procedures Procedures (including critical [facility] time)  Medications Ordered in UC Medications - No data to display  Initial Impression / Assessment and Plan / UC Course  I have reviewed the triage vital signs and the nursing notes.  Pertinent labs & imaging results that were available during my [facility] of the patient were reviewed by me and considered in my medical decision making (see chart for details).    Zofran prescribed for nausea and vomiting.  Suspect likely viral etiology and recommended bland diet, increase fluids and follow-up if symptoms fail to improve or worsen.  Final Clinical Impressions(s) / UC Diagnoses   Final diagnoses:  Nausea and vomiting, unspecified vomiting type   Discharge Instructions  None    ED Prescriptions     Medication Sig Dispense Auth. Provider    ondansetron (ZOFRAN-ODT) 4 MG disintegrating tablet Take 1 tablet (4 mg total) by mouth every 8 (eight) hours as needed. 20 tablet Tomi Bamberger, PA-C      PDMP not reviewed this encounter.   Tomi Bamberger, PA-C 10/14/21 1542

## 2021-10-14 NOTE — ED Triage Notes (Signed)
Pt presents with vomiting since waking up this morning. 

## 2021-10-16 ENCOUNTER — Inpatient Hospital Stay: Admission: RE | Admit: 2021-10-16 | Payer: 59 | Source: Ambulatory Visit

## 2021-11-04 ENCOUNTER — Other Ambulatory Visit: Payer: Medicaid Other

## 2021-12-20 ENCOUNTER — Ambulatory Visit
Admission: RE | Admit: 2021-12-20 | Discharge: 2021-12-20 | Disposition: A | Discharge status: Home / Self Care | Payer: BC Managed Care – PPO | Source: Ambulatory Visit | Attending: Internal Medicine | Admitting: Internal Medicine

## 2021-12-20 ENCOUNTER — Other Ambulatory Visit (HOSPITAL_COMMUNITY): Payer: Self-pay

## 2021-12-20 VITALS — BP 106/73 | HR 103 | Temp 98.0°F | Resp 20

## 2021-12-20 DIAGNOSIS — R52 Pain, unspecified: Secondary | ICD-10-CM | POA: Diagnosis not present

## 2021-12-20 DIAGNOSIS — J029 Acute pharyngitis, unspecified: Secondary | ICD-10-CM | POA: Diagnosis not present

## 2021-12-20 DIAGNOSIS — R059 Cough, unspecified: Secondary | ICD-10-CM | POA: Insufficient documentation

## 2021-12-20 DIAGNOSIS — Z1152 Encounter for screening for COVID-19: Secondary | ICD-10-CM | POA: Diagnosis not present

## 2021-12-20 DIAGNOSIS — B9789 Other viral agents as the cause of diseases classified elsewhere: Secondary | ICD-10-CM | POA: Insufficient documentation

## 2021-12-20 DIAGNOSIS — J069 Acute upper respiratory infection, unspecified: Secondary | ICD-10-CM | POA: Diagnosis not present

## 2021-12-20 LAB — POCT INFLUENZA A/B
Influenza A, POC: NEGATIVE
Influenza B, POC: NEGATIVE

## 2021-12-20 MED ORDER — BENZONATATE 100 MG PO CAPS
100.0000 mg | ORAL_CAPSULE | Freq: Three times a day (TID) | ORAL | 0 refills | Status: DC | PRN
  Filled 2021-12-20: qty 21, 7d supply, fill #0

## 2021-12-20 MED ORDER — FLUTICASONE PROPIONATE 50 MCG/ACT NA SUSP
1.0000 | Freq: Every day | NASAL | 0 refills | Status: DC
  Filled 2021-12-20: qty 16, 60d supply, fill #0

## 2021-12-20 MED ORDER — CHLORASEPTIC MAX SORE THROAT 1.5-33 % MT LIQD
1.0000 | OROMUCOSAL | 0 refills | Status: DC | PRN
  Filled 2021-12-20: qty 118, 30d supply, fill #0

## 2021-12-20 NOTE — ED Provider Notes (Signed)
[name]    CSN: [id number] Arrival date & time: 12/20/21  1319      History   Chief Complaint Chief Complaint  Patient presents with   Cough    Runny nose, sneezing, sore throat and body aches - Entered by patient   Generalized Body Aches   Otalgia    HPI Jersie Beel is a 41 y.o. female.   Patient presents with nasal congestion, cough, ear pain, generalized body aches, sore throat that started yesterday.  Patient denies any known fevers or sick contacts.  She has not taken any medications to help alleviate symptoms.  Denies chest pain, shortness of breath, nausea, vomiting, diarrhea, abdominal pain.   Cough Otalgia   Past Medical History:  Diagnosis Date   Abnormal Pap smear of cervix 01/2018   cytology negative, HPV positive, repeat pap in 1 year   Migraine    Vitamin D deficiency     Patient Active Problem List   Diagnosis Date Noted   Abnormal Papanicolaou smear of cervix with positive human papilloma virus (HPV) test 04/17/2021   Migraine without aura and without status migrainosus, not intractable 04/10/2021   Hyperlipidemia 07/26/2020   Morbid obesity (HCC) 07/26/2020   Vitamin D deficiency 07/25/2020   SOB (shortness of breath) on exertion 07/25/2020    Past Surgical History:  Procedure Laterality Date   CESAREAN SECTION     COLPOSCOPY  2021   genital warts      OB History     Gravida  1   Para  1   Term      Preterm      AB      Living  1      SAB      IAB      Ectopic      Multiple      Live Births               Home Medications    Prior to Admission medications   Medication Sig Start Date End Date Taking? Authorizing Provider  benzonatate (TESSALON) 100 MG capsule Take 1 capsule (100 mg total) by mouth every 8 (eight) hours as needed for cough. 12/20/21  Yes Esiah Bazinet, Rolly Salter E, FNP  fluticasone (FLONASE) 50 MCG/ACT nasal spray Place 1 spray into both nostrils daily. 12/20/21  Yes Desyre Calma, Rolly Salter E, FNP   phenol (CHLORASEPTIC) 1.4 % LIQD Use as directed 1 spray in the mouth or throat as needed for throat irritation / pain. 12/20/21  Yes Zaydin Billey, Acie Fredrickson, FNP  albuterol (VENTOLIN HFA) 108 (90 Base) MCG/ACT inhaler Inhale 2 puffs into the lungs every 6 (six) hours as needed for wheezing or shortness of breath. 07/25/20   Mayers, Cari S, PA-C  cetirizine (ZYRTEC ALLERGY) 10 MG tablet Take 1 tablet (10 mg total) by mouth daily. 07/25/20   Mayers, Cari S, PA-C  ondansetron (ZOFRAN-ODT) 4 MG disintegrating tablet Take 1 tablet (4 mg total) by mouth every 8 (eight) hours as needed. 10/14/21   Tomi Bamberger, PA-C  SUMAtriptan (IMITREX) 25 MG tablet Take 25 mg (1 tablet total) by mouth at the start of the headache. May repeat in 2 hours x 1 if headache persists. Max of 2 tabs/24 hours 09/26/20   Rema Fendt, NP  Vitamin D, Ergocalciferol, (DRISDOL) 1.25 MG (50000 UNIT) CAPS capsule Take 1 capsule (50,000 Units total) by mouth every 7 (seven) days. 04/19/21   Gerre Scull, NP    Family History Family History  Problem Relation Age of Onset   Diabetes Mother    Breast cancer Neg Hx     Social History Social History   Tobacco Use   Smoking status: Never   Smokeless tobacco: Never  Vaping Use   Vaping Use: Never used  Substance Use Topics   Alcohol use: Yes    Alcohol/week: 4.0 standard drinks of alcohol    Types: 4 Glasses of wine per week   Drug use: Never     Allergies   Patient has no known allergies.   Review of Systems Review of Systems Per HPI  Physical Exam Triage Vital Signs ED Triage Vitals  Enc Vitals Group     BP 12/20/21 1344 106/73     Pulse Rate 12/20/21 1344 (!) 103     Resp 12/20/21 1344 20     Temp 12/20/21 1344 98 F (36.7 C)     Temp src --      SpO2 12/20/21 1344 98 %     Weight --      Height --      Head Circumference --      Peak Flow --      Pain Score 12/20/21 1343 8     Pain Loc --      Pain Edu? --      Excl. in [place]? --    No data  found.  Updated Vital Signs BP 106/73   Pulse (!) 103   Temp 98 F (36.7 C)   Resp 20   SpO2 98%   Visual Acuity Right Eye Distance:   Left Eye Distance:   Bilateral Distance:    Right Eye Near:   Left Eye Near:    Bilateral Near:     Physical Exam Constitutional:      General: She is not in acute distress.    Appearance: Normal appearance. She is not toxic-appearing or diaphoretic.  HENT:     Head: Normocephalic and atraumatic.     Right Ear: Tympanic membrane and ear canal normal.     Left Ear: Tympanic membrane and ear canal normal.     Nose: Congestion present.     Mouth/Throat:     Mouth: Mucous membranes are moist.     Pharynx: Posterior oropharyngeal erythema present. No pharyngeal swelling or oropharyngeal exudate.     Tonsils: No tonsillar exudate or tonsillar abscesses.  Eyes:     Extraocular Movements: Extraocular movements intact.     Conjunctiva/sclera: Conjunctivae normal.     Pupils: Pupils are equal, round, and reactive to light.  Cardiovascular:     Rate and Rhythm: Normal rate and regular rhythm.     Pulses: Normal pulses.     Heart sounds: Normal heart sounds.  Pulmonary:     Effort: Pulmonary effort is normal. No respiratory distress.     Breath sounds: Normal breath sounds. No stridor. No wheezing, rhonchi or rales.  Abdominal:     General: Abdomen is flat. Bowel sounds are normal.     Palpations: Abdomen is soft.  Musculoskeletal:        General: Normal range of motion.     Cervical back: Normal range of motion.  Skin:    General: Skin is warm and dry.  Neurological:     General: No focal deficit present.     Mental Status: She is alert and oriented to person, place, and time. Mental status is at baseline.  Psychiatric:        Mood and  Affect: Mood normal.        Behavior: Behavior normal.      UC Treatments / Results  Labs (all labs ordered are listed, but only abnormal results are displayed) Labs Reviewed  SARS CORONAVIRUS 2  (TAT 6-24 HRS)  POCT INFLUENZA A/B    EKG   Radiology No results found.  Procedures Procedures (including critical care time)  Medications Ordered in UC Medications - No data to display  Initial Impression / Assessment and Plan / UC Course  I have reviewed the triage vital signs and the nursing notes.  Pertinent labs & imaging results that were available during my care of the patient were reviewed by me and considered in my medical decision making (see chart for details).     Patient presents with symptoms likely from a viral upper respiratory infection. Differential includes bacterial pneumonia, sinusitis, allergic rhinitis, COVID-19, flu, RSV. Do not suspect underlying cardiopulmonary process. Symptoms seem unlikely related to ACS, CHF or COPD exacerbations, pneumonia, pneumothorax. Patient is nontoxic appearing and not in need of emergent medical intervention.  Rapid flu is negative.  Do not think strep testing is necessary given appearance of posterior pharynx on exam.  COVID test is pending.  Recommended symptom control with over the counter medications.  Patient sent prescriptions to help alleviate symptoms.  Return if symptoms fail to improve in 1-2 weeks or you develop shortness of breath, chest pain, severe headache. Patient states understanding and is agreeable.  Discharged with PCP followup.  Final Clinical Impressions(s) / UC Diagnoses   Final diagnoses:  Viral upper respiratory tract infection with cough     Discharge Instructions      You have a viral illness which should run its course and self resolve with symptomatic treatment as we discussed.  Your rapid flu was negative.  COVID test is pending.  We will call if it is positive.  I have prescribed you 3 medications to help alleviate symptoms.  Please follow-up if symptoms persist or worsen.     ED Prescriptions     Medication Sig Dispense Auth. Provider   fluticasone (FLONASE) 50 MCG/ACT nasal spray  Place 1 spray into both nostrils daily. 16 g Winda Summerall, Hildred Alamin E, [place]   benzonatate (TESSALON) 100 MG capsule Take 1 capsule (100 mg total) by mouth every 8 (eight) hours as needed for cough. 21 capsule [place], [place] E, [place]   phenol (CHLORASEPTIC) 1.4 % LIQD Use as directed 1 spray in the mouth or throat as needed for throat irritation / pain. 118 mL Teodora Medici, [place]      PDMP not reviewed this encounter.   Teodora Medici, [place] 12/20/21 1429

## 2021-12-20 NOTE — ED Triage Notes (Signed)
Pt is present today with c/o cough, congestion, left ear pain,and body aches. Pt sax started yesterday

## 2021-12-20 NOTE — Discharge Instructions (Signed)
You have a viral illness which should run its course and self resolve with symptomatic treatment as we discussed.  Your rapid flu was negative.  COVID test is pending.  We will call if it is positive.  I have prescribed you 3 medications to help alleviate symptoms.  Please follow-up if symptoms persist or worsen.

## 2021-12-21 LAB — SARS CORONAVIRUS 2 (TAT 6-24 HRS): SARS Coronavirus 2: NEGATIVE

## 2021-12-22 ENCOUNTER — Telehealth: Payer: Self-pay | Admitting: Emergency Medicine

## 2021-12-22 NOTE — Telephone Encounter (Signed)
Spoke with patient regarding her inquire about needing more time out of work. Per provider if patient is still having sx then she needs to come back in the clinic Monday to be reevaluated.

## 2021-12-23 ENCOUNTER — Ambulatory Visit
Admission: RE | Admit: 2021-12-23 | Discharge: 2021-12-23 | Disposition: A | Discharge status: Home / Self Care | Payer: BC Managed Care – PPO | Source: Ambulatory Visit | Attending: Nurse Practitioner | Admitting: Nurse Practitioner

## 2021-12-23 VITALS — BP 119/80 | HR 100 | Temp 98.3°F | Resp 16

## 2021-12-23 DIAGNOSIS — J069 Acute upper respiratory infection, unspecified: Secondary | ICD-10-CM

## 2021-12-23 DIAGNOSIS — J040 Acute laryngitis: Secondary | ICD-10-CM

## 2021-12-23 NOTE — ED Triage Notes (Signed)
Pt c/o cough, nasal drainage, sneezing states was seen at this UC on Friday and given cough medicine that is not helping so she is getting bad sleep in addition to now her voice is hoarse.

## 2021-12-23 NOTE — ED Provider Notes (Signed)
[name]    CSN: [id number] Arrival date & time: 12/23/21  1244      History   Chief Complaint Chief Complaint  Patient presents with   Cough    Entered by patient    HPI Nashali Ditmer is a 41 y.o. female.   Patient here today for evaluation of continued cough, nasal congestion, sneezing and loss of voice.  She reports that she has been taking over-the-counter medication without significant improvement as well as what she was prescribed.  She has not had fever.  She was requesting work note for further time off.  The history is provided by the patient.  Cough Associated symptoms: sore throat   Associated symptoms: no chills, no ear pain, no eye discharge, no fever, no shortness of breath and no wheezing     Past Medical History:  Diagnosis Date   Abnormal Pap smear of cervix 01/2018   cytology negative, HPV positive, repeat pap in 1 year   Migraine    Vitamin D deficiency     Patient Active Problem List   Diagnosis Date Noted   Abnormal Papanicolaou smear of cervix with positive human papilloma virus (HPV) test 04/17/2021   Migraine without aura and without status migrainosus, not intractable 04/10/2021   Hyperlipidemia 07/26/2020   Morbid obesity (HCC) 07/26/2020   Vitamin D deficiency 07/25/2020   SOB (shortness of breath) on exertion 07/25/2020    Past Surgical History:  Procedure Laterality Date   CESAREAN SECTION     COLPOSCOPY  2021   genital warts      OB History     Gravida  1   Para  1   Term      Preterm      AB      Living  1      SAB      IAB      Ectopic      Multiple      Live Births               Home Medications    Prior to Admission medications   Medication Sig Start Date End Date Taking? Authorizing Provider  albuterol (VENTOLIN HFA) 108 (90 Base) MCG/ACT inhaler Inhale 2 puffs into the lungs every 6 (six) hours as needed for wheezing or shortness of breath. 07/25/20   Mayers, Cari S, PA-C   benzonatate (TESSALON) 100 MG capsule Take 1 capsule (100 mg total) by mouth every 8 (eight) hours as needed for cough. 12/20/21   Gustavus Bryant, FNP  cetirizine (ZYRTEC ALLERGY) 10 MG tablet Take 1 tablet (10 mg total) by mouth daily. 07/25/20   Mayers, Cari S, PA-C  fluticasone (FLONASE) 50 MCG/ACT nasal spray Place 1 spray into both nostrils daily. 12/20/21   Gustavus Bryant, FNP  ondansetron (ZOFRAN-ODT) 4 MG disintegrating tablet Take 1 tablet (4 mg total) by mouth every 8 (eight) hours as needed. 10/14/21   Tomi Bamberger, PA-C  Phenol-Glycerin (CHLORASEPTIC MAX SORE THROAT) 1.5-33 % LIQD Use as directed 1 spray in the mouth or throat as needed. 12/20/21   Gustavus Bryant, FNP  SUMAtriptan (IMITREX) 25 MG tablet Take 25 mg (1 tablet total) by mouth at the start of the headache. May repeat in 2 hours x 1 if headache persists. Max of 2 tabs/24 hours 09/26/20   Rema Fendt, NP  Vitamin D, Ergocalciferol, (DRISDOL) 1.25 MG (50000 UNIT) CAPS capsule Take 1 capsule (50,000 Units total) by mouth every  7 (seven) days. 04/19/21   McElwee, Jake Church, NP    Family History Family History  Problem Relation Age of Onset   Diabetes Mother    Breast cancer Neg Hx     Social History Social History   Tobacco Use   Smoking status: Never   Smokeless tobacco: Never  Vaping Use   Vaping Use: Never used  Substance Use Topics   Alcohol use: Yes    Alcohol/week: 4.0 standard drinks of alcohol    Types: 4 Glasses of wine per week   Drug use: Never     Allergies   Patient has no known allergies.   Review of Systems Review of Systems  Constitutional:  Negative for chills and fever.  HENT:  Positive for congestion, sore throat and voice change. Negative for ear pain.   Eyes:  Negative for discharge and redness.  Respiratory:  Positive for cough. Negative for shortness of breath and wheezing.   Gastrointestinal:  Negative for abdominal pain, diarrhea, nausea and vomiting.     Physical  Exam Triage Vital Signs ED Triage Vitals  Enc Vitals Group     BP 12/23/21 1329 119/80     Pulse Rate 12/23/21 1329 100     Resp 12/23/21 1329 16     Temp 12/23/21 1329 98.3 F (36.8 C)     Temp Source 12/23/21 1329 Oral     SpO2 12/23/21 1329 98 %     Weight --      Height --      Head Circumference --      Peak Flow --      Pain Score 12/23/21 1330 0     Pain Loc --      Pain Edu? --      Excl. in [place]? --    No data found.  Updated Vital Signs BP 119/80 (BP Location: Left Arm)   Pulse 100   Temp 98.3 F (36.8 C) (Oral)   Resp 16   SpO2 98%     Physical Exam Vitals and nursing note reviewed.  Constitutional:      General: She is not in acute distress.    Appearance: Normal appearance. She is not ill-appearing.  HENT:     Head: Normocephalic and atraumatic.     Nose: Congestion present.     Mouth/Throat:     Mouth: Mucous membranes are moist.     Pharynx: No oropharyngeal exudate or posterior oropharyngeal erythema.     Comments: Hoarse voice noted Eyes:     Conjunctiva/sclera: Conjunctivae normal.  Cardiovascular:     Rate and Rhythm: Normal rate and regular rhythm.     Heart sounds: Normal heart sounds. No murmur heard. Pulmonary:     Effort: Pulmonary effort is normal. No respiratory distress.     Breath sounds: Normal breath sounds. No wheezing, rhonchi or rales.  Skin:    General: Skin is warm and dry.  Neurological:     Mental Status: She is alert.  Psychiatric:        Mood and Affect: Mood normal.        Thought Content: Thought content normal.      UC Treatments / Results  Labs (all labs ordered are listed, but only abnormal results are displayed) Labs Reviewed - No data to display  EKG   Radiology No results found.  Procedures Procedures (including critical care time)  Medications Ordered in UC Medications - No data to display  Initial Impression / Assessment  and Plan / UC Course  I have reviewed the triage vital signs and the  nursing notes.  Pertinent labs & imaging results that were available during my care of the patient were reviewed by me and considered in my medical decision making (see chart for details).    Work note provided as requested. Viral screening negative at last office visit. Recommend she continue symptomatic treatment and increase fluids, follow up if no gradual improvement.   Final Clinical Impressions(s) / UC Diagnoses   Final diagnoses:  Acute upper respiratory infection  Laryngitis   Discharge Instructions   None    ED Prescriptions   None    PDMP not reviewed this encounter.   Tomi Bamberger, PA-C 12/23/21 1800

## 2022-01-01 ENCOUNTER — Ambulatory Visit
Admission: RE | Admit: 2022-01-01 | Discharge: 2022-01-01 | Disposition: A | Discharge status: Home / Self Care | Payer: Medicaid Other | Source: Ambulatory Visit | Attending: Family | Admitting: Family

## 2022-01-01 DIAGNOSIS — N6323 Unspecified lump in the left breast, lower outer quadrant: Secondary | ICD-10-CM | POA: Diagnosis not present

## 2022-01-01 DIAGNOSIS — N6320 Unspecified lump in the left breast, unspecified quadrant: Secondary | ICD-10-CM

## 2022-01-03 ENCOUNTER — Other Ambulatory Visit (HOSPITAL_COMMUNITY): Payer: Self-pay

## 2022-01-03 MED ORDER — IBUPROFEN 800 MG PO TABS
800.0000 mg | ORAL_TABLET | Freq: Three times a day (TID) | ORAL | 0 refills | Status: DC | PRN
  Filled 2022-01-03: qty 15, 5d supply, fill #0

## 2022-01-03 MED ORDER — AMOXICILLIN 875 MG PO TABS
875.0000 mg | ORAL_TABLET | Freq: Two times a day (BID) | ORAL | 0 refills | Status: DC
  Filled 2022-01-03: qty 20, 10d supply, fill #0

## 2022-01-03 MED ORDER — HYDROCODONE-ACETAMINOPHEN 5-325 MG PO TABS
1.0000 | ORAL_TABLET | Freq: Four times a day (QID) | ORAL | 0 refills | Status: DC | PRN
  Filled 2022-01-03: qty 10, 3d supply, fill #0

## 2022-01-07 ENCOUNTER — Other Ambulatory Visit: Payer: Self-pay

## 2022-01-07 ENCOUNTER — Emergency Department (HOSPITAL_COMMUNITY)
Admission: EM | Admit: 2022-01-07 | Discharge: 2022-01-07 | Discharge status: Against Medical Advice | Payer: PRIVATE HEALTH INSURANCE | Attending: Emergency Medicine | Admitting: Emergency Medicine

## 2022-01-07 ENCOUNTER — Other Ambulatory Visit (HOSPITAL_COMMUNITY): Payer: Self-pay

## 2022-01-07 ENCOUNTER — Encounter (HOSPITAL_COMMUNITY): Payer: Self-pay

## 2022-01-07 ENCOUNTER — Ambulatory Visit
Admission: EM | Admit: 2022-01-07 | Discharge: 2022-01-07 | Disposition: A | Discharge status: Home / Self Care | Payer: Worker's Compensation | Attending: Internal Medicine | Admitting: Internal Medicine

## 2022-01-07 ENCOUNTER — Ambulatory Visit (HOSPITAL_COMMUNITY)
Admission: EM | Admit: 2022-01-07 | Discharge: 2022-01-07 | Disposition: A | Discharge status: Home / Self Care | Payer: Worker's Compensation | Attending: Internal Medicine | Admitting: Internal Medicine

## 2022-01-07 DIAGNOSIS — W540XXA Bitten by dog, initial encounter: Secondary | ICD-10-CM | POA: Insufficient documentation

## 2022-01-07 DIAGNOSIS — Y9301 Activity, walking, marching and hiking: Secondary | ICD-10-CM | POA: Insufficient documentation

## 2022-01-07 DIAGNOSIS — Z23 Encounter for immunization: Secondary | ICD-10-CM | POA: Diagnosis not present

## 2022-01-07 DIAGNOSIS — S71131A Puncture wound without foreign body, right thigh, initial encounter: Secondary | ICD-10-CM | POA: Diagnosis not present

## 2022-01-07 DIAGNOSIS — S71151A Open bite, right thigh, initial encounter: Secondary | ICD-10-CM

## 2022-01-07 DIAGNOSIS — Y92410 Unspecified street and highway as the place of occurrence of the external cause: Secondary | ICD-10-CM | POA: Insufficient documentation

## 2022-01-07 DIAGNOSIS — Z203 Contact with and (suspected) exposure to rabies: Secondary | ICD-10-CM | POA: Diagnosis not present

## 2022-01-07 MED ORDER — RABIES IMMUNE GLOBULIN 150 UNIT/ML IM INJ
INJECTION | INTRAMUSCULAR | Status: AC
  Filled 2022-01-07: qty 20

## 2022-01-07 MED ORDER — RABIES VACCINE, PCEC IM SUSR
1.0000 mL | Freq: Once | INTRAMUSCULAR | Status: AC
  Administered 2022-01-07: 1 mL via INTRAMUSCULAR

## 2022-01-07 MED ORDER — IBUPROFEN 200 MG PO TABS
600.0000 mg | ORAL_TABLET | Freq: Once | ORAL | Status: AC
  Administered 2022-01-07: 600 mg via ORAL
  Filled 2022-01-07: qty 3

## 2022-01-07 MED ORDER — RABIES VACCINE, PCEC IM SUSR
INTRAMUSCULAR | Status: AC
  Filled 2022-01-07: qty 1

## 2022-01-07 MED ORDER — RABIES IMMUNE GLOBULIN 150 UNIT/ML IM INJ: 20.0000 [IU]/kg | INJECTION | Freq: Once | INTRAMUSCULAR | Status: DC

## 2022-01-07 MED ORDER — AMOXICILLIN-POT CLAVULANATE 875-125 MG PO TABS
1.0000 | ORAL_TABLET | Freq: Two times a day (BID) | ORAL | 0 refills | Status: DC
  Filled 2022-01-07: qty 14, 7d supply, fill #0

## 2022-01-07 MED ORDER — RABIES IMMUNE GLOBULIN 150 UNIT/ML IM INJ
INJECTION | INTRAMUSCULAR | Status: AC
  Filled 2022-01-07: qty 4

## 2022-01-07 MED ORDER — RABIES IMMUNE GLOBULIN 150 UNIT/ML IM INJ
20.0000 [IU]/kg | INJECTION | Freq: Once | INTRAMUSCULAR | Status: AC
  Administered 2022-01-07: 2025 [IU]

## 2022-01-07 NOTE — ED Notes (Signed)
Pt presented to [facility] for rabies series start from dog bite yesterday-pt weighed and made aware that we do not have rabies IG dose available to [facility] contacted and will see pt-pt advised to go to [facility]

## 2022-01-07 NOTE — ED Triage Notes (Signed)
Pt reports dog bite to right thigh yesterday. Pt does not know if dog has all of its vaccinations

## 2022-01-07 NOTE — Discharge Instructions (Addendum)
You were given your initial rabies vaccination series in the clinic today. We have scheduled you for an appointment to come back in 3 days to receive your day 3 vaccination.  It is very important that you return to urgent care and get this vaccination.  Schedule an appointment to receive vaccination on day 7 as well as day 14.  After these vaccinations, you will have completed the rabies postexposure prophylactic treatment regimen.  Stop taking amoxicillin and start taking Augmentin antibiotic.  Take this antibiotic twice daily for the next 7 days to prevent infection to the dog bite.  Keep the wound clean and dry. Wash with gentle soap and water.  If you notice any redness, swelling, white/yellow drainage, or worsening pain to the site, please return to urgent care for reevaluation.  You may take Tylenol or ibuprofen as needed for pain.   If you develop any new or worsening symptoms or do not improve in the next 2 to 3 days, please return.  If your symptoms are severe, please go to the emergency room.  Follow-up with your primary care provider for further evaluation and management of your symptoms as well as ongoing wellness visits.  I hope you feel better!

## 2022-01-07 NOTE — ED Provider Notes (Incomplete)
[facility]    CSN: [id number] Arrival date & time: 01/07/22  1912      History   Chief Complaint Chief Complaint  Patient presents with   Animal Bite    HPI Becky Gilmore is a 41 y.o. female.   Patient presents to urgent care for evaluation of dog bite to the right lateral thigh with associated bruising to the area. Dog bite happened yesterday. Patient was exiting her work [employer] yesterday when a man with a golden doodle dog on a leash came up to her. The dog suddenly became aggressive, bit her leg, then the owner pulled the dog away. Patient was wearing pants at time of incident and did not ask the man if the dog was vaccinated against rabies. She would like the series today. Wound is superficial and causing pain to the leg.  She is not on blood thinners, has never received rabies vaccination series in the past, and denies continued bleeding from the wound. No fever, chills, numbness or tingling to the bilateral lower extremities, warmth to the site, or erythema/swelling surrounding the site.  Last tetanus injection was within the last 5 years.     Past Medical History:  Diagnosis Date   Abnormal Pap smear of cervix 01/2018   cytology negative, HPV positive, repeat pap in 1 year   Migraine    Vitamin D deficiency     Patient Active Problem List   Diagnosis Date Noted   Abnormal Papanicolaou smear of cervix with positive human papilloma virus (HPV) test 04/17/2021   Migraine without aura and without status migrainosus, not intractable 04/10/2021   Hyperlipidemia 07/26/2020   Morbid obesity (HCC) 07/26/2020   Vitamin D deficiency 07/25/2020   SOB (shortness of breath) on exertion 07/25/2020    Past Surgical History:  Procedure Laterality Date   CESAREAN SECTION     COLPOSCOPY  2021   genital warts      OB History     Gravida  1   Para  1   Term      Preterm      AB      Living  1      SAB      IAB      Ectopic      Multiple       Live Births               Home Medications    Prior to Admission medications   Medication Sig Start Date End Date Taking? Authorizing Provider  amoxicillin-clavulanate (AUGMENTIN) 875-125 MG tablet Take 1 tablet by mouth every 12 (twelve) hours. 01/07/22  Yes Carlisle Beers, FNP  albuterol (VENTOLIN HFA) 108 (90 Base) MCG/ACT inhaler Inhale 2 puffs into the lungs every 6 (six) hours as needed for wheezing or shortness of breath. 07/25/20   Mayers, Cari S, PA-C  benzonatate (TESSALON) 100 MG capsule Take 1 capsule (100 mg total) by mouth every 8 (eight) hours as needed for cough. 12/20/21   Gustavus Bryant, FNP  cetirizine (ZYRTEC ALLERGY) 10 MG tablet Take 1 tablet (10 mg total) by mouth daily. 07/25/20   Mayers, Cari S, PA-C  fluticasone (FLONASE) 50 MCG/ACT nasal spray Place 1 spray into both nostrils daily. 12/20/21   Gustavus Bryant, FNP  HYDROcodone-acetaminophen (NORCO/VICODIN) 5-325 MG tablet Take 1 tablet by mouth every 6 (six) hours as needed for moderate pain 01/03/22     ibuprofen (ADVIL) 800 MG tablet Take 1 tablet (  800 mg total) by mouth every 8 (eight) hours as needed for up to 5 days for mild pain (1-3) 01/03/22     ondansetron (ZOFRAN-ODT) 4 MG disintegrating tablet Take 1 tablet (4 mg total) by mouth every 8 (eight) hours as needed. 10/14/21   Tomi Bamberger, PA-C  Phenol-Glycerin (CHLORASEPTIC MAX SORE THROAT) 1.5-33 % LIQD Use as directed 1 spray in the mouth or throat as needed. 12/20/21   Gustavus Bryant, FNP  SUMAtriptan (IMITREX) 25 MG tablet Take 25 mg (1 tablet total) by mouth at the start of the headache. May repeat in 2 hours x 1 if headache persists. Max of 2 tabs/24 hours 09/26/20   Rema Fendt, NP  Vitamin D, Ergocalciferol, (DRISDOL) 1.25 MG (50000 UNIT) CAPS capsule Take 1 capsule (50,000 Units total) by mouth every 7 (seven) days. 04/19/21   McElwee, Jake Church, NP    Family History Family History  Problem Relation Age of Onset   Diabetes Mother    Breast  cancer Neg Hx     Social History Social History   Tobacco Use   Smoking status: Never   Smokeless tobacco: Never  Vaping Use   Vaping Use: Never used  Substance Use Topics   Alcohol use: Yes    Alcohol/week: 4.0 standard drinks of alcohol    Types: 4 Glasses of wine per week   Drug use: Never     Allergies   Patient has no known allergies.   Review of Systems Review of Systems Per HPI  Physical Exam Triage Vital Signs ED Triage Vitals  Enc Vitals Group     BP 01/07/22 1946 (!) 142/95     Pulse Rate 01/07/22 1946 94     Resp 01/07/22 1946 18     Temp 01/07/22 1946 97.8 F (36.6 C)     Temp src --      SpO2 01/07/22 1946 100 %     Weight --      Height --      Head Circumference --      Peak Flow --      Pain Score 01/07/22 1950 0     Pain Loc --      Pain Edu? --      Excl. in [place]? --    No data found.  Updated Vital Signs BP (!) 142/95 (BP Location: Left Arm)   Pulse 94   Temp 97.8 F (36.6 C)   Resp 18   SpO2 100%   Visual Acuity Right Eye Distance:   Left Eye Distance:   Bilateral Distance:    Right Eye Near:   Left Eye Near:    Bilateral Near:     Physical Exam Vitals and nursing note reviewed.  Constitutional:      Appearance: She is not ill-appearing or toxic-appearing.  HENT:     Head: Normocephalic and atraumatic.     Right Ear: Hearing and external ear normal.     Left Ear: Hearing and external ear normal.     Nose: Nose normal.     Mouth/Throat:     Lips: Pink.  Eyes:     General: Lids are normal. Vision grossly intact. Gaze aligned appropriately.     Extraocular Movements: Extraocular movements intact.     Conjunctiva/sclera: Conjunctivae normal.  Pulmonary:     Effort: Pulmonary effort is normal.  Musculoskeletal:     Cervical back: Neck supple.  Skin:    General: Skin is warm and dry.  Capillary Refill: Capillary refill takes less than 2 seconds.     Findings: Lesion present. No rash.     Comments: 0.5-1cm in  diameter puncture wound to the right lateral thigh approximately 3 inches proximal of the right knee with associated ecchymosis proximally to the puncture wound. See image below for details. No surrounding soft tissue swelling, erythema, or warmth.   Neurological:     General: No focal deficit present.     Mental Status: She is alert and oriented to person, place, and time. Mental status is at baseline.     Cranial Nerves: No dysarthria or facial asymmetry.  Psychiatric:        Mood and Affect: Mood normal.        Speech: Speech normal.        Behavior: Behavior normal.        Thought Content: Thought content normal.        Judgment: Judgment normal.     Bruising to the left, dog bite to area of blood.  UC Treatments / Results  Labs (all labs ordered are listed, but only abnormal results are displayed) Labs Reviewed - No data to display  EKG   Radiology No results found.  Procedures Procedures (including critical care time)  Medications Ordered in UC Medications  rabies immune globulin (HYPERRAB/KEDRAB) injection 2,025 Units (2,025 Units Infiltration Given 01/07/22 2027)  rabies vaccine (RABAVERT) injection 1 mL (1 mL Intramuscular Given 01/07/22 2028)    Initial Impression / Assessment and Plan / UC Course  I have reviewed the triage vital signs and the nursing notes.  Pertinent labs & imaging results that were available during my care of the patient were reviewed by me and considered in my medical decision making (see chart for details).   1. Dog bite of right thigh Weight based dose of KEDRAB given with patient's consent. 2 mLs infiltrated to the dog bite site, then the rest given to the bilateral thighs and one dose in the deltoid. Day 0 rabies vaccine given in opposing deltoid from immunoglobulin. Patient tolerated well. Appointment made for patient to return to urgent care on day 3 for second rabies vaccine in series. Discussed need for additional vaccinations on day 7  and day 14 as well.  Wound cleansed and dressed in clinic. Patient to keep wound clean and dry. Return if noticing signs of infection. Currently amoxicillin starting January 03, 2022 for dental infection. Advised to stop amoxicillin and start Augmentin antibiotic as bite happened greater than 12 hours ago. Augmentin 2 times daily for the next 7 days sent to pharmacy to be taken with food.   May use ibuprofen/tylenol as needed for pain and inflammation to the area.  Discussed physical exam and available lab work findings in clinic with patient.  Counseled patient regarding appropriate use of medications and potential side effects for all medications recommended or prescribed today. Discussed red flag signs and symptoms of worsening condition,when to call the PCP office, return to urgent care, and when to seek higher level of care in the emergency department. Patient verbalizes understanding and agreement with plan. All questions answered. Patient discharged in stable condition.     Final Clinical Impressions(s) / UC Diagnoses   Final diagnoses:  Dog bite of right thigh, initial encounter  Need for post exposure prophylaxis for rabies     Discharge Instructions      You were given your initial rabies vaccination series in the clinic today. We have scheduled you for an  appointment to come back in 3 days to receive your day 3 vaccination.  It is very important that you return to urgent care and get this vaccination.  Schedule an appointment to receive vaccination on day 7 as well as day 14.  After these vaccinations, you will have completed the rabies postexposure prophylactic treatment regimen.  Stop taking amoxicillin and start taking Augmentin antibiotic.  Take this antibiotic twice daily for the next 7 days to prevent infection to the dog bite.  Keep the wound clean and dry. Wash with gentle soap and water.  If you notice any redness, swelling, white/yellow drainage, or worsening pain  to the site, please return to urgent care for reevaluation.  You may take Tylenol or ibuprofen as needed for pain.   If you develop any new or worsening symptoms or do not improve in the next 2 to 3 days, please return.  If your symptoms are severe, please go to the emergency room.  Follow-up with your primary care provider for further evaluation and management of your symptoms as well as ongoing wellness visits.  I hope you feel better!    ED Prescriptions     Medication Sig Dispense Auth. Provider   amoxicillin-clavulanate (AUGMENTIN) 875-125 MG tablet Take 1 tablet by mouth every 12 (twelve) hours. 14 tablet Carlisle Beers, FNP      PDMP not reviewed this encounter.   Carlisle Beers, FNP 01/08/22 1935    Carlisle Beers, FNP 01/08/22 2104

## 2022-01-07 NOTE — ED Triage Notes (Signed)
Pt states was bite by a dog yesterday to her rt thigh. States dog was on a leash and with his owner. Unknown vaccines.

## 2022-01-07 NOTE — ED Provider Triage Note (Signed)
Emergency Medicine Provider Triage Evaluation Note  Becky Gilmore , a 41 y.o. female  was evaluated in triage.  Pt complains of dog bite.  Occurred yesterday.  Patient was walking down the street when an oncoming pedestrian was walking his dog the dog bit her in in the right lateral thigh.  Patient states that the bite has been more painful and swollen chills only prompted her to be evaluate today.  Patient does not know anything about the dog whether the dog has been vaccinated. Animal control was not contacted.  Review of Systems  Positive: See above Negative: See above  Physical Exam  BP 128/82   Pulse 98   Temp 98.2 F (36.8 C) (Oral)   Resp 18   Ht 5\' 3"  (1.6 m)   Wt 97.5 kg   SpO2 100%   BMI 38.09 kg/m  Gen:   Awake, no distress   Resp:  Normal effort  MSK:   Moves extremities without difficulty  Other:  Small puncture wound noted to the right lateral thigh.  Mild swelling but no erythema circumferentially around the site.  Medical Decision Making  Medically screening exam initiated at 2:46 PM.  Appropriate orders placed.  TONIANNE FINE was informed that the remainder of the evaluation will be completed by another provider, this initial triage assessment does not replace that evaluation, and the importance of remaining in the ED until their evaluation is complete.  Work up started   Ronalee Belts, PA-C 01/07/22 1456

## 2022-01-08 ENCOUNTER — Other Ambulatory Visit (HOSPITAL_COMMUNITY): Payer: Self-pay

## 2022-01-08 ENCOUNTER — Telehealth: Payer: Self-pay

## 2022-01-08 NOTE — Telephone Encounter (Signed)
Transition Care Management Follow-up Telephone Call Date of discharge and from where: TCM DC [facility] 01-07-22 Dx :dog bite right thigh  How have you been since you were released from the hospital? Doing fine  Any questions or concerns? No  Items Reviewed: Did the pt receive and understand the discharge instructions provided? Yes  Medications obtained and verified? Yes  Other? No  Any new allergies since your discharge? No  Dietary orders reviewed? Yes Do you have support at home? Yes   Home Care and Equipment/Supplies: Were home health services ordered? no If so, what is the name of the agency? na  Has the agency set up a time to come to the patient's home? not applicable Were any new equipment or medical supplies ordered?  No What is the name of the medical supply agency? na Were you able to get the supplies/equipment? not applicable Do you have any questions related to the use of the equipment or supplies? no  Functional Questionnaire: (I = Independent and D = Dependent) ADLs: I  Bathing/Dressing- I  Meal Prep- I  Eating- I  Maintaining continence- I  Transferring/Ambulation- I  Managing Meds- I  Follow up appointments reviewed:  PCP Hospital f/u appt confirmed? No  Pt does not feel that she needs to come in for FU with PCP Specialist Hospital f/u appt confirmed? Yes  Scheduled to get rabies vaccine at the urgent care . Are transportation arrangements needed? No  If their condition worsens, is the pt aware to call PCP or go to the Emergency Dept.? Yes Was the patient provided with contact information for the PCP's office or ED? Yes Was to pt encouraged to call back with questions or concerns? Yes   Woodfin Ganja LPN [facility] Nurse Health Advisor Direct Dial [phone]

## 2022-01-10 ENCOUNTER — Ambulatory Visit (HOSPITAL_COMMUNITY)
Admission: EM | Admit: 2022-01-10 | Discharge: 2022-01-10 | Disposition: A | Discharge status: Home / Self Care | Payer: Worker's Compensation | Attending: Internal Medicine | Admitting: Internal Medicine

## 2022-01-10 DIAGNOSIS — Z203 Contact with and (suspected) exposure to rabies: Secondary | ICD-10-CM

## 2022-01-10 MED ORDER — RABIES VACCINE, PCEC IM SUSR
1.0000 mL | Freq: Once | INTRAMUSCULAR | Status: AC
  Administered 2022-01-10: 1 mL via INTRAMUSCULAR

## 2022-01-10 MED ORDER — RABIES VACCINE, PCEC IM SUSR
INTRAMUSCULAR | Status: AC
  Filled 2022-01-10: qty 1

## 2022-01-10 NOTE — ED Notes (Signed)
Pt came into clinic today for rabies vaccine #2, she tolerated it well.

## 2022-01-17 ENCOUNTER — Ambulatory Visit (HOSPITAL_COMMUNITY)
Admission: EM | Admit: 2022-01-17 | Discharge: 2022-01-17 | Disposition: A | Discharge status: Home / Self Care | Payer: BC Managed Care – PPO | Attending: Internal Medicine | Admitting: Internal Medicine

## 2022-01-17 ENCOUNTER — Encounter (HOSPITAL_COMMUNITY): Payer: Self-pay

## 2022-01-17 DIAGNOSIS — Z203 Contact with and (suspected) exposure to rabies: Secondary | ICD-10-CM | POA: Diagnosis not present

## 2022-01-17 MED ORDER — RABIES VACCINE, PCEC IM SUSR
INTRAMUSCULAR | Status: AC
  Filled 2022-01-17: qty 1

## 2022-01-17 MED ORDER — RABIES VACCINE, PCEC IM SUSR
1.0000 mL | Freq: Once | INTRAMUSCULAR | Status: AC
  Administered 2022-01-17: 1 mL via INTRAMUSCULAR

## 2022-01-17 NOTE — ED Triage Notes (Signed)
Pt presents for 3rd rabies vaccine. Denies any other complaints.

## 2022-01-31 ENCOUNTER — Ambulatory Visit (HOSPITAL_COMMUNITY)
Admission: EM | Admit: 2022-01-31 | Discharge: 2022-01-31 | Disposition: A | Discharge status: Home / Self Care | Payer: BC Managed Care – PPO | Attending: Internal Medicine | Admitting: Internal Medicine

## 2022-01-31 ENCOUNTER — Encounter (HOSPITAL_COMMUNITY): Payer: Self-pay

## 2022-01-31 DIAGNOSIS — Z203 Contact with and (suspected) exposure to rabies: Secondary | ICD-10-CM

## 2022-01-31 MED ORDER — RABIES VACCINE, PCEC IM SUSR
1.0000 mL | Freq: Once | INTRAMUSCULAR | Status: AC
  Administered 2022-01-31: 1 mL via INTRAMUSCULAR

## 2022-01-31 MED ORDER — RABIES VACCINE, PCEC IM SUSR
INTRAMUSCULAR | Status: AC
  Filled 2022-01-31: qty 1

## 2022-01-31 NOTE — ED Triage Notes (Signed)
Pt is here for last dose of rabies vaccine [place], pt denies any complaints .

## 2022-01-31 NOTE — ED Notes (Signed)
Pt received 4 th rabies vaccine in the [place] . Pt tolerated rabies vaccine well

## 2022-03-10 ENCOUNTER — Ambulatory Visit
Admission: EM | Admit: 2022-03-10 | Discharge: 2022-03-10 | Disposition: A | Discharge status: Home / Self Care | Payer: BC Managed Care – PPO | Attending: Nurse Practitioner | Admitting: Nurse Practitioner

## 2022-03-10 DIAGNOSIS — R051 Acute cough: Secondary | ICD-10-CM | POA: Insufficient documentation

## 2022-03-10 DIAGNOSIS — Z20822 Contact with and (suspected) exposure to covid-19: Secondary | ICD-10-CM | POA: Insufficient documentation

## 2022-03-10 DIAGNOSIS — B349 Viral infection, unspecified: Secondary | ICD-10-CM | POA: Diagnosis not present

## 2022-03-10 MED ORDER — PROMETHAZINE-DM 6.25-15 MG/5ML PO SYRP
5.0000 mL | ORAL_SOLUTION | Freq: Four times a day (QID) | ORAL | 0 refills | Status: DC | PRN
  Filled 2022-03-10: qty 118, 6d supply, fill #0

## 2022-03-10 NOTE — ED Provider Notes (Signed)
[facility]    CSN: [id number] Arrival date & time: 03/10/22  [place]      History   Chief Complaint Chief Complaint  Patient presents with   Headache    Headache, cough, nasal congestion and body aches. X2 days    HPI Becky Gilmore is a 42 y.o. female who presents for evaluation of URI symptoms for 2 days. Patient reports associated symptoms of headache, body aches, cough, congestion. Denies N/V/D, ears, sore throat, ear pain, shortness of breath. Patient does not have a hx of asthma or smoking.  Patient was exposed to [place] via workaholic.  No recent travel. Pt is vaccinated for COVID. Pt is vaccinated for flu this season. Pt has taken cough medicine OTC for symptoms. Pt has no other concerns at this time.    Headache Associated symptoms: congestion and cough     Past Medical History:  Diagnosis Date   Abnormal Pap smear of cervix 01/2018   cytology negative, HPV positive, repeat pap in 1 year   Migraine    Vitamin D deficiency     Patient Active Problem List   Diagnosis Date Noted   Abnormal Papanicolaou smear of cervix with positive human papilloma virus (HPV) test 04/17/2021   Migraine without aura and without status migrainosus, not intractable 04/10/2021   Hyperlipidemia 07/26/2020   Morbid obesity ([place]) 07/26/2020   Vitamin D deficiency 07/25/2020   SOB (shortness of breath) on exertion 07/25/2020    Past Surgical History:  Procedure Laterality Date   CESAREAN SECTION     COLPOSCOPY  2021   genital warts      OB History     Gravida  1   Para  1   Term      Preterm      AB      Living  1      SAB      IAB      Ectopic      Multiple      Live Births               Home Medications    Prior to Admission medications   Medication Sig Start Date End Date Taking? Authorizing Provider  albuterol (VENTOLIN HFA) 108 (90 Base) MCG/ACT inhaler Inhale 2 puffs into the lungs every 6 (six) hours as needed for wheezing or  shortness of breath. 07/25/20  Yes Mayers, Cari S, PA-C  cetirizine (ZYRTEC ALLERGY) 10 MG tablet Take 1 tablet (10 mg total) by mouth daily. 07/25/20  Yes Mayers, Cari S, PA-C  fluticasone (FLONASE) 50 MCG/ACT nasal spray Place 1 spray into both nostrils daily. 12/20/21  Yes [place], Michele Rockers, FNP  HYDROcodone-acetaminophen (NORCO/VICODIN) 5-325 MG tablet Take 1 tablet by mouth every 6 (six) hours as needed for moderate pain 01/03/22  Yes   ibuprofen (ADVIL) 800 MG tablet Take 1 tablet (800 mg total) by mouth every 8 (eight) hours as needed for up to 5 days for mild pain (1-3) 01/03/22  Yes   ondansetron (ZOFRAN-ODT) 4 MG disintegrating tablet Take 1 tablet (4 mg total) by mouth every 8 (eight) hours as needed. 10/14/21  Yes Francene Finders, PA-C  Phenol-Glycerin (CHLORASEPTIC MAX SORE THROAT) 1.5-33 % LIQD Use as directed 1 spray in the mouth or throat as needed. 12/20/21  Yes [place], Michele Rockers, FNP  promethazine-dextromethorphan (PROMETHAZINE-DM) 6.25-15 MG/5ML syrup Take 5 mLs by mouth 4 (four) times daily as needed for cough. 03/10/22  Yes Melynda Ripple, NP  SUMAtriptan (IMITREX) 25 MG tablet Take 25 mg (1 tablet total) by mouth at the start of the headache. May repeat in 2 hours x 1 if headache persists. Max of 2 tabs/24 hours 09/26/20  Yes Minette Brine, Amy J, NP  Vitamin D, Ergocalciferol, (DRISDOL) 1.25 MG (50000 UNIT) CAPS capsule Take 1 capsule (50,000 Units total) by mouth every 7 (seven) days. 04/19/21  Yes McElwee, Lauren A, NP  amoxicillin-clavulanate (AUGMENTIN) 875-125 MG tablet Take 1 tablet by mouth every 12 (twelve) hours. 01/07/22   Talbot Grumbling, FNP  benzonatate (TESSALON) 100 MG capsule Take 1 capsule (100 mg total) by mouth every 8 (eight) hours as needed for cough. 12/20/21   Teodora Medici, FNP    Family History Family History  Problem Relation Age of Onset   Diabetes Mother    Breast cancer Neg Hx     Social History Social History   Tobacco Use   Smoking status: Never    Smokeless tobacco: Never  Vaping Use   Vaping Use: Never used  Substance Use Topics   Alcohol use: Yes    Alcohol/week: 4.0 standard drinks of alcohol    Types: 4 Glasses of wine per week   Drug use: Never     Allergies   Patient has no known allergies.   Review of Systems Review of Systems  HENT:  Positive for congestion.   Respiratory:  Positive for cough.   Neurological:  Positive for headaches.     Physical Exam Triage Vital Signs ED Triage Vitals  Enc Vitals Group     BP 03/10/22 1950 112/76     Pulse Rate 03/10/22 1950 99     Resp 03/10/22 1950 18     Temp 03/10/22 1950 98.1 F (36.7 C)     Temp Source 03/10/22 1947 Oral     SpO2 03/10/22 1950 94 %     Weight 03/10/22 1945 215 lb (97.5 kg)     Height 03/10/22 1945 5\' 3"  (1.6 m)     Head Circumference --      Peak Flow --      Pain Score 03/10/22 1945 4     Pain Loc --      Pain Edu? --      Excl. in [place]? --    No data found.  Updated Vital Signs BP 112/76 (BP Location: Right Arm)   Pulse 99   Temp 98.1 F (36.7 C) (Oral)   Resp 18   Ht 5\' 3"  (1.6 m)   Wt 215 lb (97.5 kg)   LMP 02/20/2022   SpO2 94%   BMI 38.09 kg/m   Visual Acuity Right Eye Distance:   Left Eye Distance:   Bilateral Distance:    Right Eye Near:   Left Eye Near:    Bilateral Near:     Physical Exam Vitals and nursing note reviewed.  Constitutional:      General: She is not in acute distress.    Appearance: She is well-developed. She is not ill-appearing.  HENT:     Head: Normocephalic and atraumatic.     Right Ear: Tympanic membrane and ear canal normal.     Left Ear: Tympanic membrane and ear canal normal.     Nose: Congestion present.     Mouth/Throat:     Mouth: Mucous membranes are moist.     Pharynx: Oropharynx is clear. Uvula midline. No oropharyngeal exudate or posterior oropharyngeal erythema.     Tonsils: No tonsillar exudate or  tonsillar abscesses.  Eyes:     Conjunctiva/sclera: Conjunctivae normal.      Pupils: Pupils are equal, round, and reactive to light.  Cardiovascular:     Rate and Rhythm: Normal rate and regular rhythm.     Heart sounds: Normal heart sounds.  Pulmonary:     Effort: Pulmonary effort is normal.     Breath sounds: Normal breath sounds.  Musculoskeletal:     Cervical back: Normal range of motion and neck supple.  Lymphadenopathy:     Cervical: No cervical adenopathy.  Skin:    General: Skin is warm and dry.  Neurological:     General: No focal deficit present.     Mental Status: She is alert and oriented to person, place, and time.  Psychiatric:        Mood and Affect: Mood normal.        Behavior: Behavior normal.      UC Treatments / Results  Labs (all labs ordered are listed, but only abnormal results are displayed) Labs Reviewed  SARS CORONAVIRUS 2 (TAT 6-24 HRS)    EKG   Radiology No results found.  Procedures Procedures (including critical care time)  Medications Ordered in UC Medications - No data to display  Initial Impression / Assessment and Plan / UC Course  I have reviewed the triage vital signs and the nursing notes.  Pertinent labs & imaging results that were available during my care of the patient were reviewed by me and considered in my medical decision making (see chart for details).     Reviewed exam and symptoms with patient.  No red flags on exam. COVID PCR and will contact with results Discussed viral illness and symptomatic treatment Promethazine DM as needed cough.  Side effect profile reviewed OTC analgesics as needed Follow-up with PCP in 2 days for recheck ER precautions reviewed and patient verbalized understanding Final Clinical Impressions(s) / UC Diagnoses   Final diagnoses:  Exposure to COVID-19 virus  Viral illness  Acute cough     Discharge Instructions      The clinic will contact you with results of the COVID test done today Promethazine DM as needed for cough.  Please note this medication can  make you drowsy.  Do not drink alcohol or drive on this medication Rest and fluids Over-the-counter Tylenol or ibuprofen as needed Follow-up with your PCP in 2 days for recheck Please go to the emergency room for any worsening symptoms   ED Prescriptions     Medication Sig Dispense Auth. Provider   promethazine-dextromethorphan (PROMETHAZINE-DM) 6.25-15 MG/5ML syrup Take 5 mLs by mouth 4 (four) times daily as needed for cough. 118 mL Melynda Ripple, NP      PDMP not reviewed this encounter.   Melynda Ripple, NP 03/10/22 2008

## 2022-03-10 NOTE — Discharge Instructions (Signed)
The clinic will contact you with results of the COVID test done today Promethazine DM as needed for cough.  Please note this medication can make you drowsy.  Do not drink alcohol or drive on this medication Rest and fluids Over-the-counter Tylenol or ibuprofen as needed Follow-up with your PCP in 2 days for recheck Please go to the emergency room for any worsening symptoms

## 2022-03-10 NOTE — ED Triage Notes (Signed)
Pt states that she has a headache, body aches, coughing, and nasal congestion. X2 days

## 2022-03-11 ENCOUNTER — Other Ambulatory Visit: Payer: Self-pay

## 2022-03-11 DIAGNOSIS — U071 COVID-19: Secondary | ICD-10-CM | POA: Diagnosis not present

## 2022-03-11 DIAGNOSIS — M791 Myalgia, unspecified site: Secondary | ICD-10-CM | POA: Diagnosis not present

## 2022-03-11 DIAGNOSIS — J988 Other specified respiratory disorders: Secondary | ICD-10-CM | POA: Diagnosis not present

## 2022-03-11 DIAGNOSIS — R519 Headache, unspecified: Secondary | ICD-10-CM | POA: Diagnosis not present

## 2022-03-11 DIAGNOSIS — R051 Acute cough: Secondary | ICD-10-CM | POA: Diagnosis not present

## 2022-03-11 LAB — SARS CORONAVIRUS 2 (TAT 6-24 HRS): SARS Coronavirus 2: POSITIVE — AB

## 2022-04-28 IMAGING — US US BREAST*R* LIMITED INC AXILLA
1 series · 7 of 7 positions shown · non-contrast
Comparison: Previous exam(s).

CLINICAL DATA: The patient was called back for a left breast mass
and a right breast asymmetry.



[Series 1: us breast*right* limited inc axilla · 0.07mm/px · 7 of 7 slices shown]
[im 1/7]
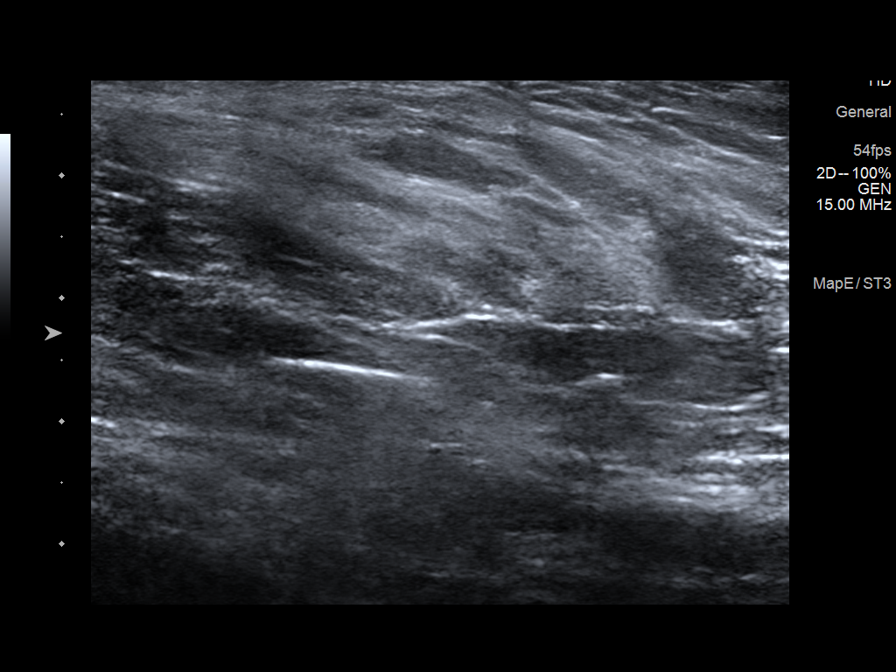
[im 2/7]
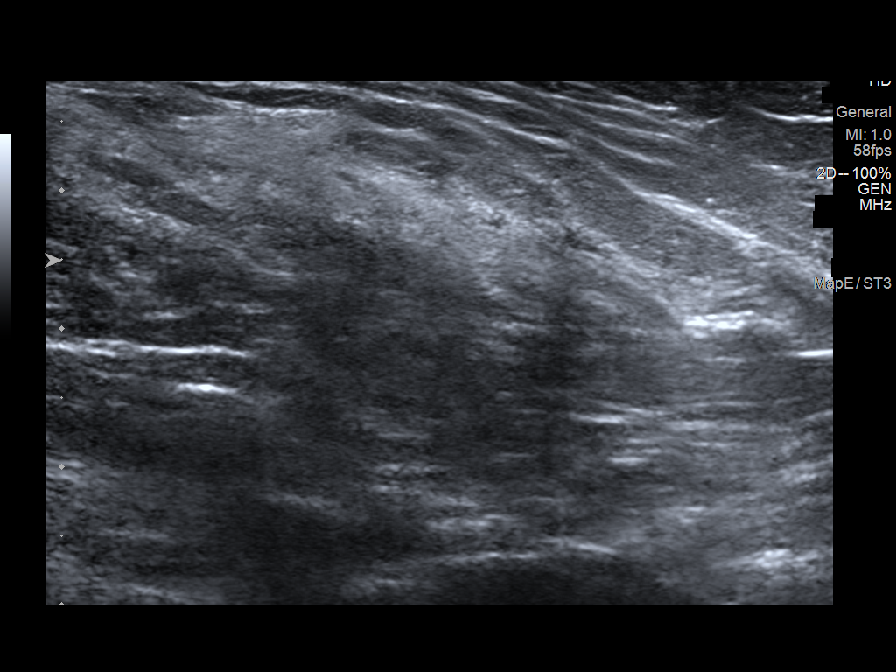
[im 3/7]
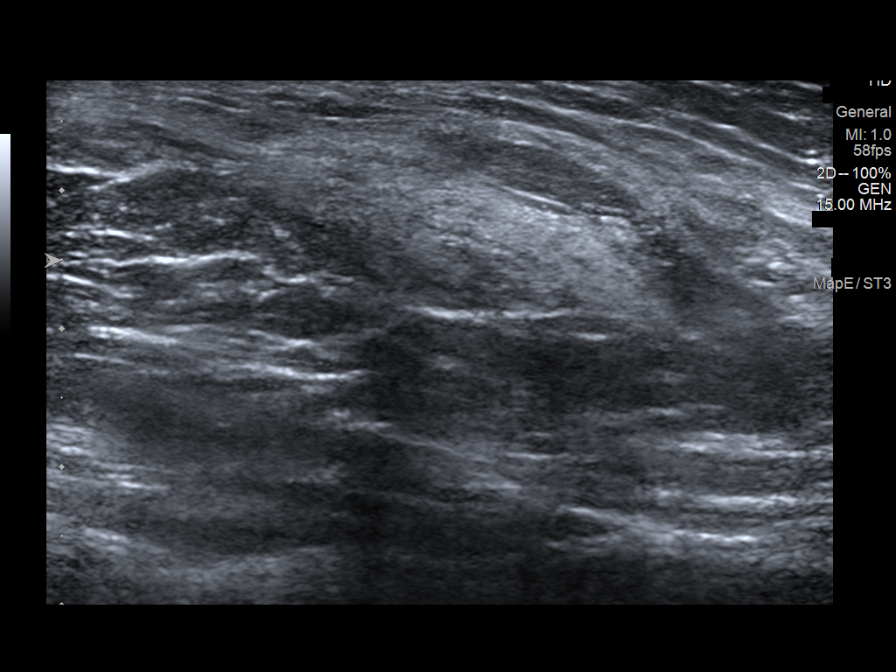
[im 4/7]
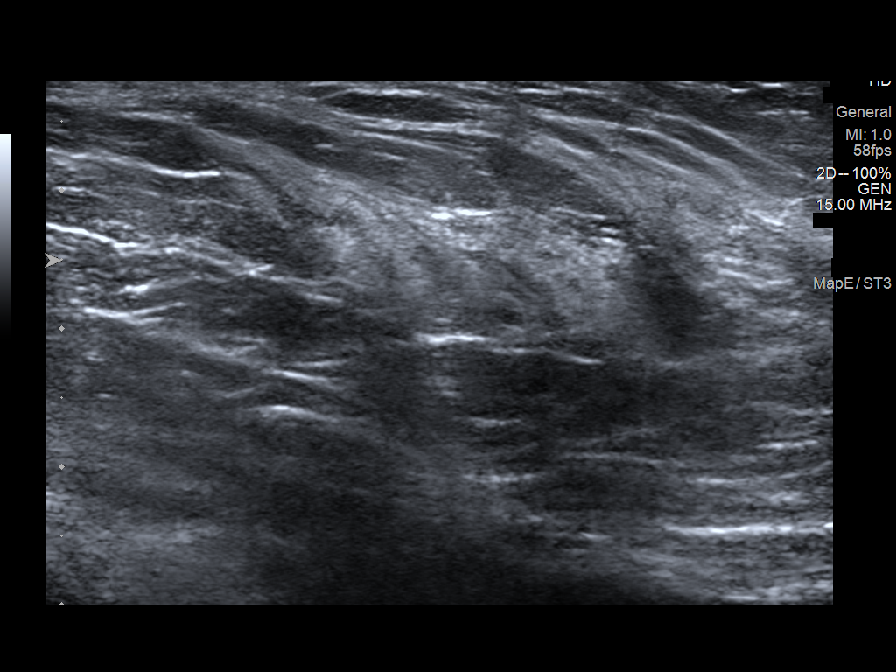
[im 5/7]
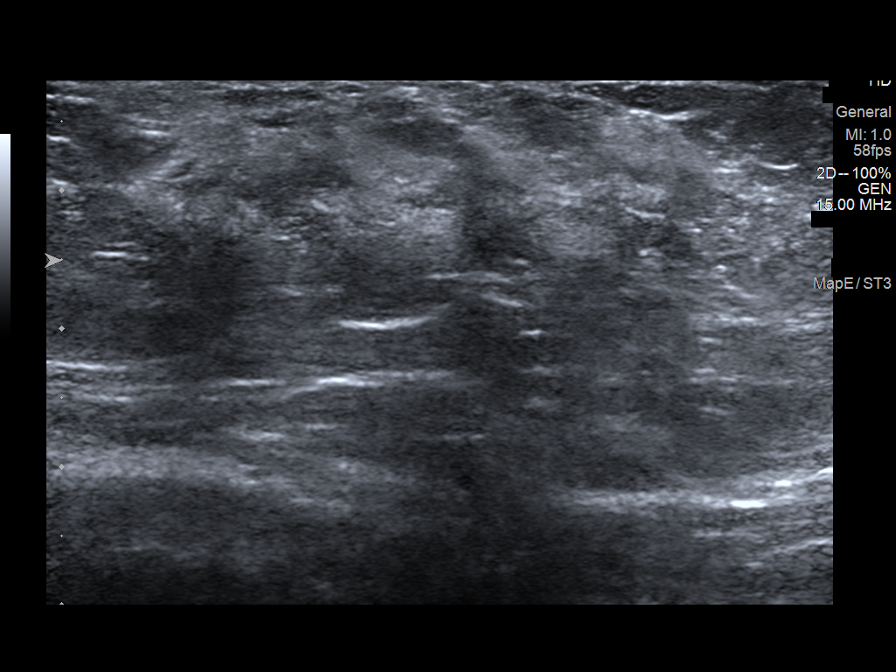
[im 6/7]
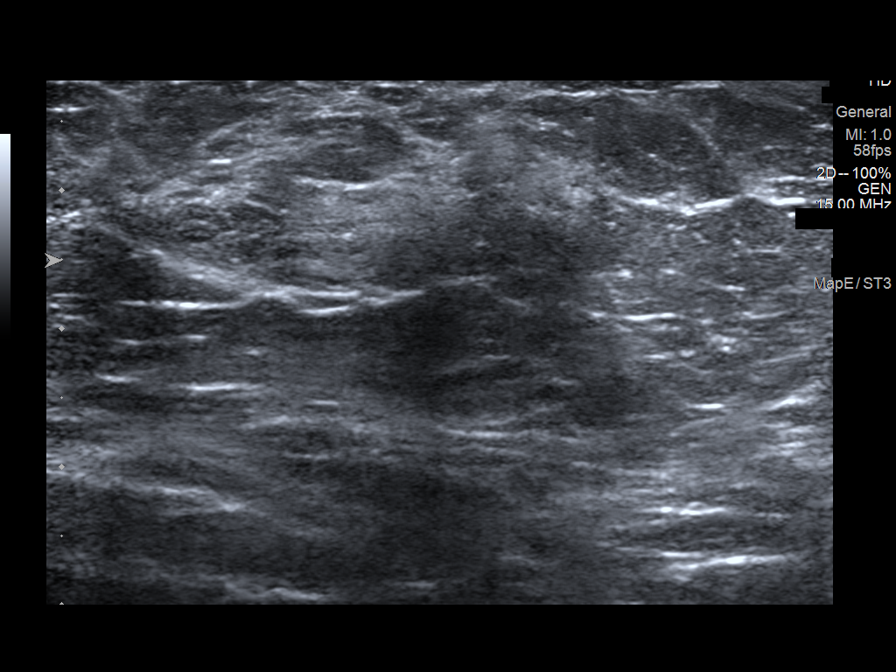
[im 7/7]
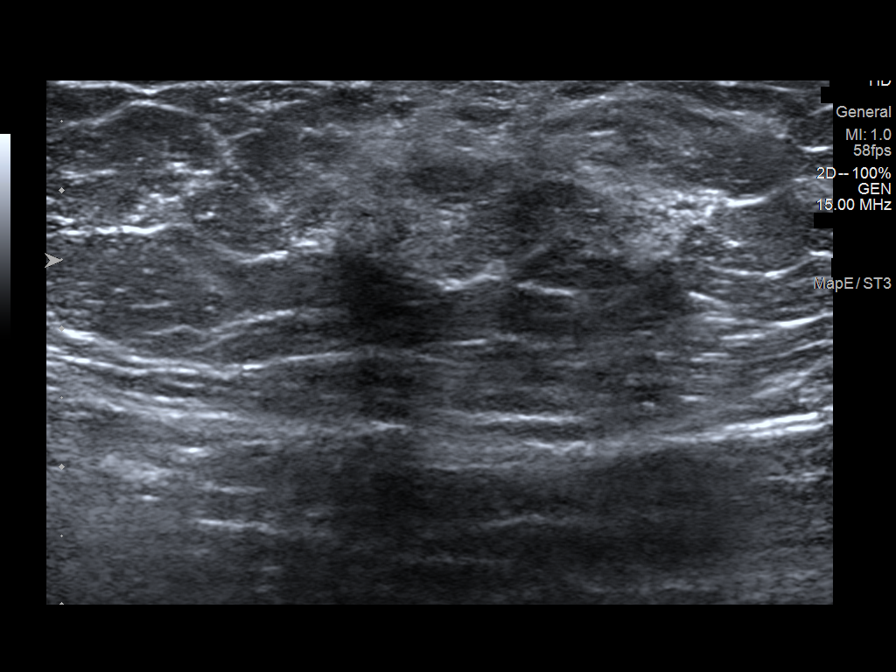

[7 of 7 positions shown; findings below may reference images not displayed]

ACR Breast Density Category c: The breast tissue is heterogeneously
dense, which may obscure small masses.
FINDINGS: The left breast mass persists on today's imaging. The asymmetry in
the superior right breast is accessory glandular tissue. No definite
mass in this region.

Targeted ultrasound is performed, showing accessory glandular tissue
in the low right axilla.

There is a probably benign mass which is circumscribed and
hypoechoic without posterior shadowing in the left breast at [DATE],
14 cm from the nipple containing a single coarse calcification.
There is mild associated increased through transmission. This mass
measures 14 x 9 by 14 mm.
IMPRESSION: Probably benign left breast mass. Accessory glandular tissue in the
low right axilla of no significance. No other suspicious findings.

RECOMMENDATION:
Recommend six-month follow-up ultrasound of the probably benign left
breast mass.

I have discussed the findings and recommendations with the patient.
If applicable, a reminder letter will be sent to the patient
regarding the next appointment.

BI-RADS CATEGORY  3: Probably benign.

## 2022-04-28 IMAGING — MG DIGITAL DIAGNOSTIC BILAT W/ TOMO W/ CAD
6 of 10 series · 6 of 30 positions shown · non-contrast
Comparison: Previous exam(s).

CLINICAL DATA: The patient was called back for a left breast mass
and a right breast asymmetry.



[R ML synth-2D]
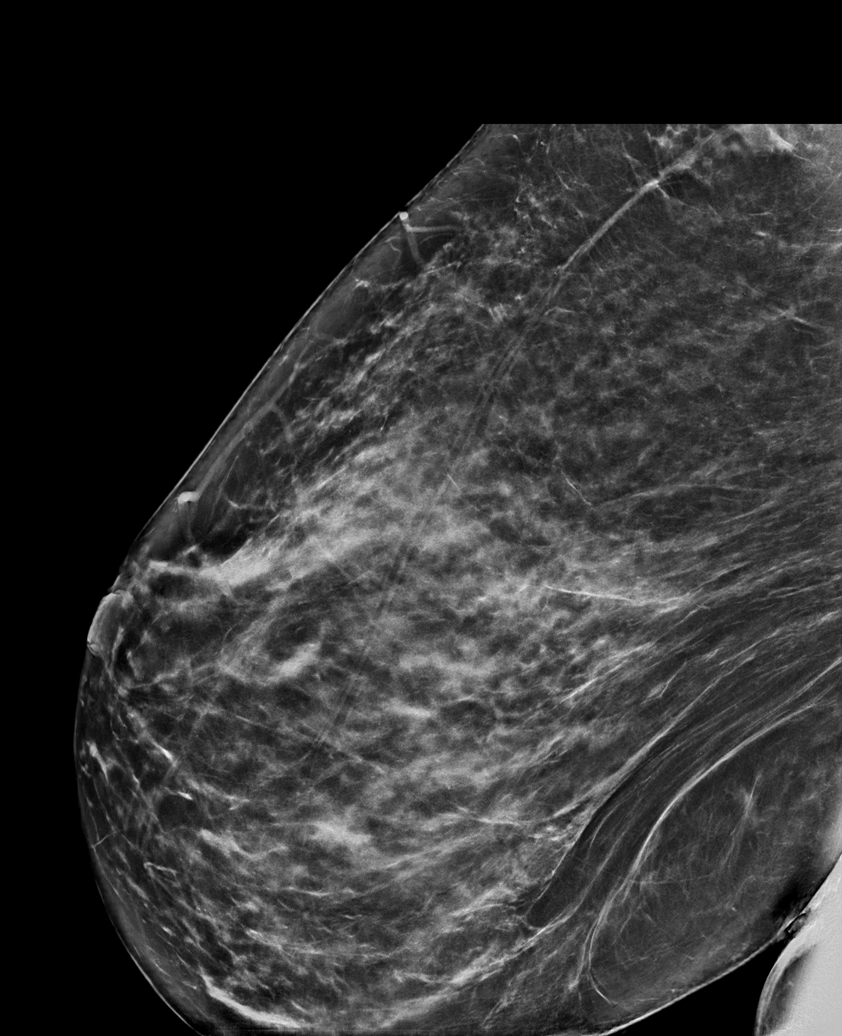

[R MLO synth-2D (1 of 2)]
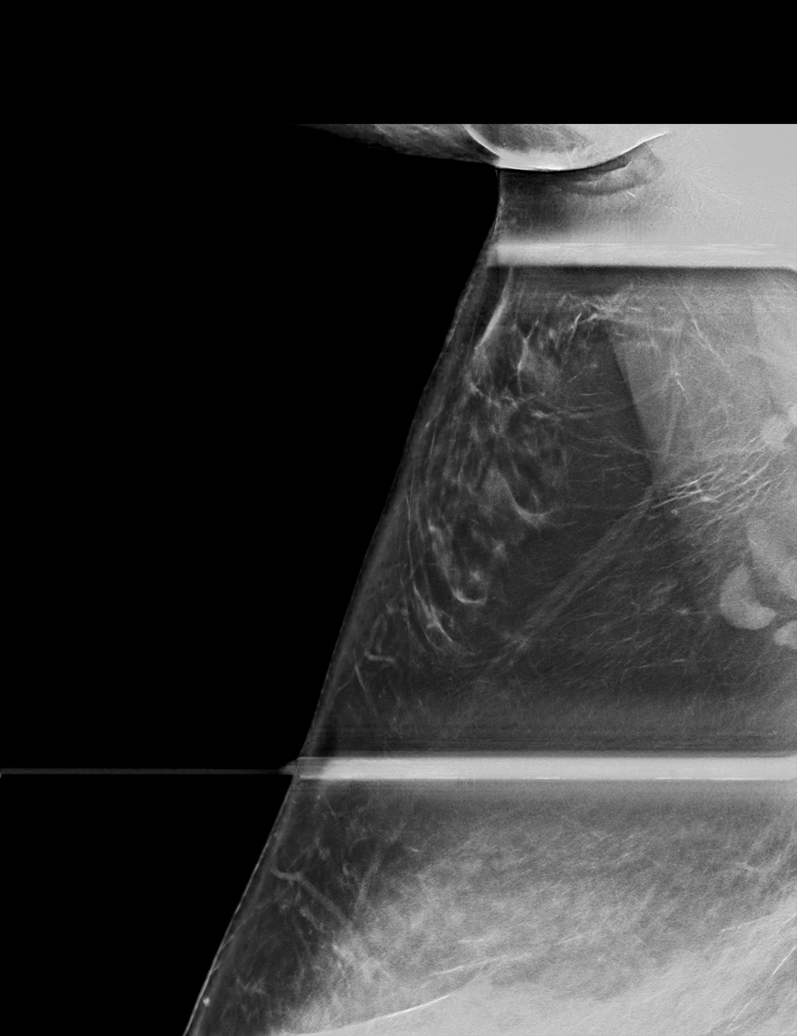

[L CC synth-2D]
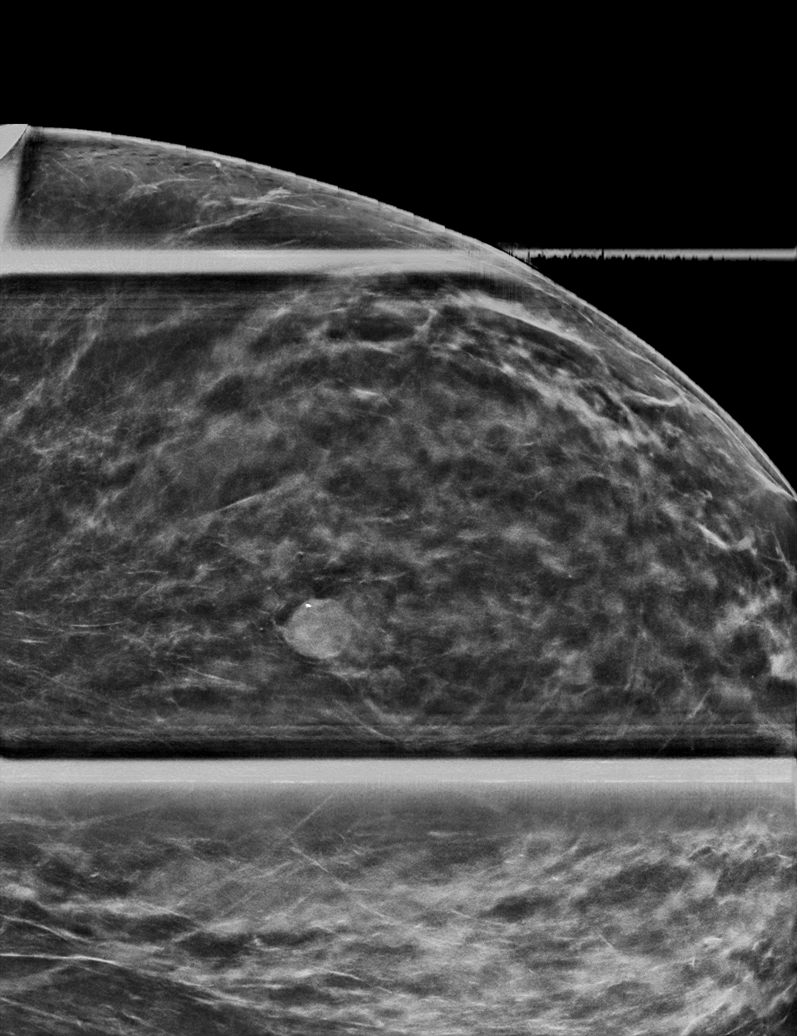

[R MLO synth-2D (2 of 2)]
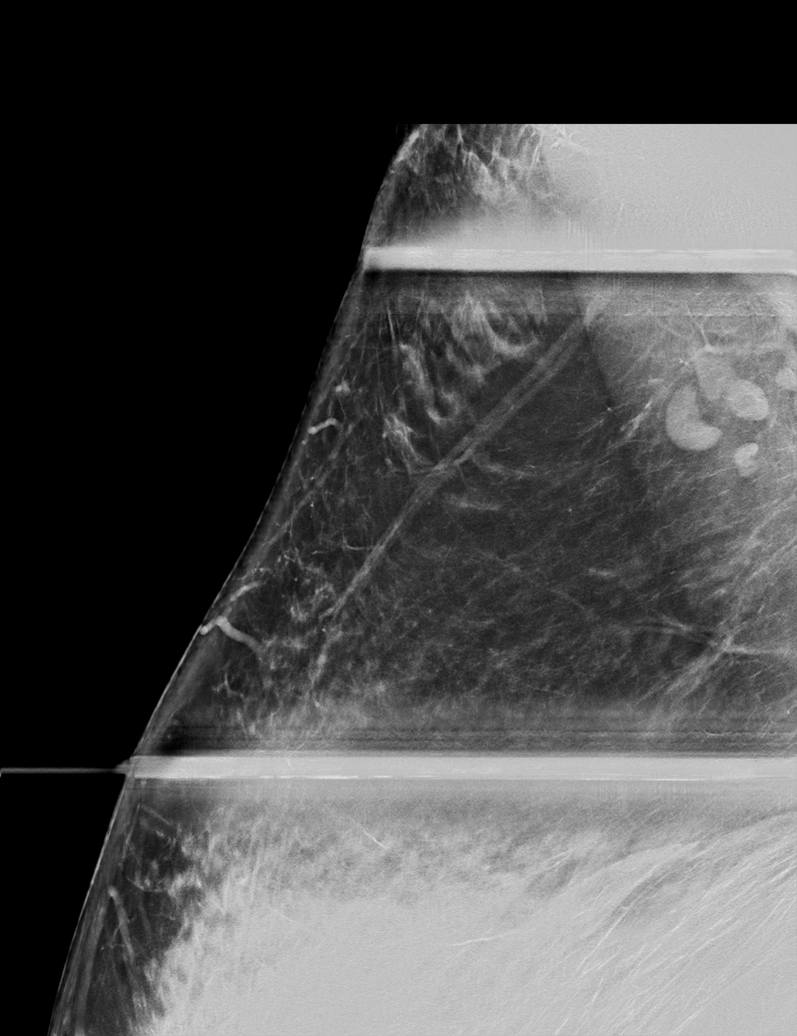

[L MLO synth-2D]
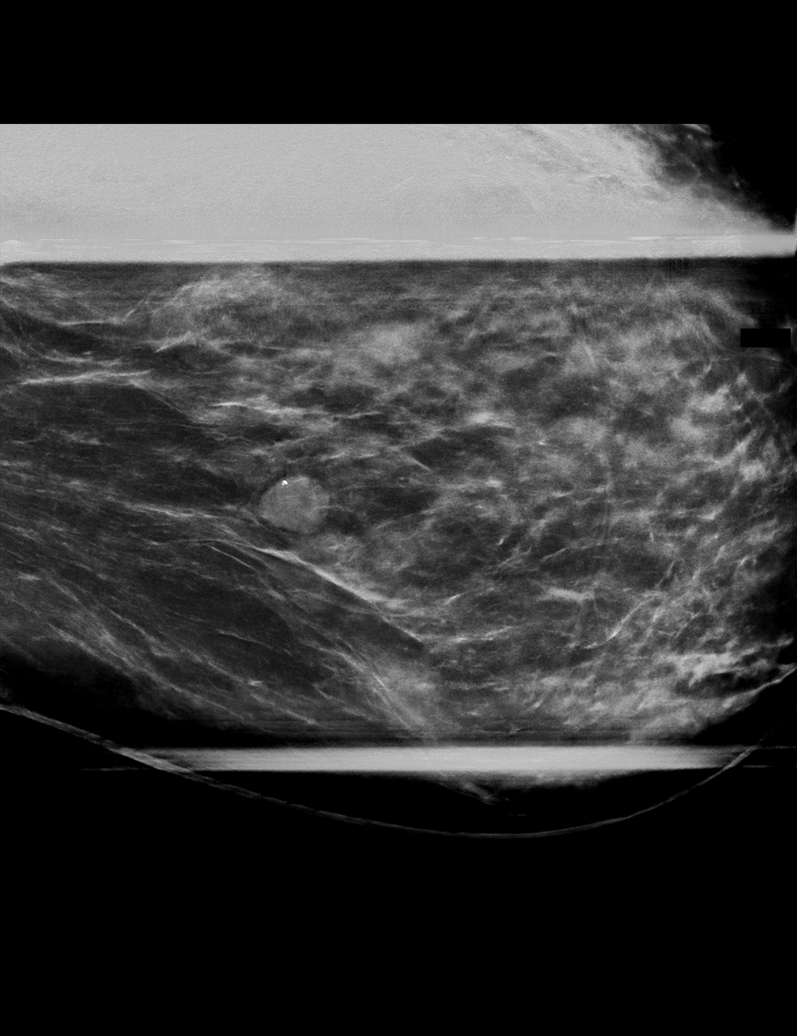

[L MLO tomo · tomo slice 46/91.0]
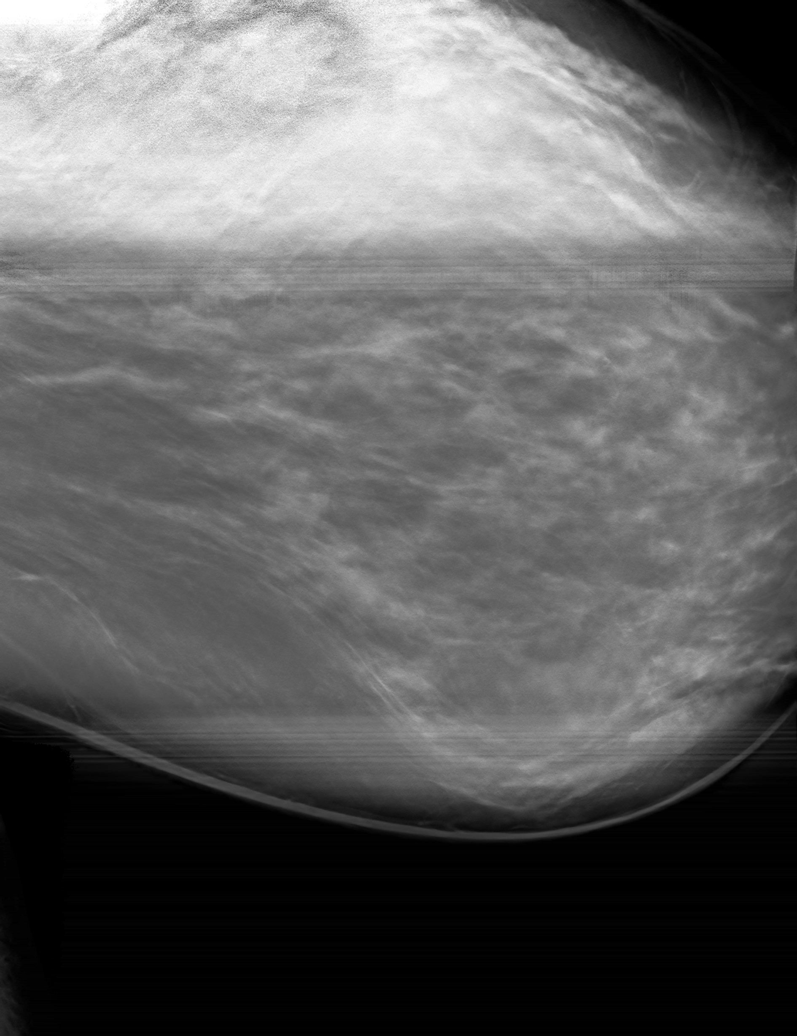

[6 of 30 positions shown; findings below may reference images not displayed]

ACR Breast Density Category c: The breast tissue is heterogeneously
dense, which may obscure small masses.
FINDINGS: The left breast mass persists on today's imaging. The asymmetry in
the superior right breast is accessory glandular tissue. No definite
mass in this region.

Targeted ultrasound is performed, showing accessory glandular tissue
in the low right axilla.

There is a probably benign mass which is circumscribed and
hypoechoic without posterior shadowing in the left breast at [DATE],
14 cm from the nipple containing a single coarse calcification.
There is mild associated increased through transmission. This mass
measures 14 x 9 by 14 mm.
IMPRESSION: Probably benign left breast mass. Accessory glandular tissue in the
low right axilla of no significance. No other suspicious findings.

RECOMMENDATION:
Recommend six-month follow-up ultrasound of the probably benign left
breast mass.

I have discussed the findings and recommendations with the patient.
If applicable, a reminder letter will be sent to the patient
regarding the next appointment.

BI-RADS CATEGORY  3: Probably benign.

## 2022-04-29 ENCOUNTER — Ambulatory Visit (INDEPENDENT_AMBULATORY_CARE_PROVIDER_SITE_OTHER): Payer: BC Managed Care – PPO | Admitting: Nurse Practitioner

## 2022-04-29 ENCOUNTER — Other Ambulatory Visit (HOSPITAL_COMMUNITY): Payer: Self-pay

## 2022-04-29 ENCOUNTER — Encounter: Payer: Self-pay | Admitting: Nurse Practitioner

## 2022-04-29 ENCOUNTER — Other Ambulatory Visit (HOSPITAL_COMMUNITY)
Admission: RE | Admit: 2022-04-29 | Discharge: 2022-04-29 | Disposition: A | Discharge status: Home / Self Care | Payer: BC Managed Care – PPO | Source: Ambulatory Visit | Attending: Nurse Practitioner | Admitting: Nurse Practitioner

## 2022-04-29 VITALS — BP 108/64 | HR 97 | Temp 98.6°F | Ht 63.0 in | Wt 219.4 lb

## 2022-04-29 DIAGNOSIS — E559 Vitamin D deficiency, unspecified: Secondary | ICD-10-CM

## 2022-04-29 DIAGNOSIS — Z124 Encounter for screening for malignant neoplasm of cervix: Secondary | ICD-10-CM

## 2022-04-29 DIAGNOSIS — Z114 Encounter for screening for human immunodeficiency virus [HIV]: Secondary | ICD-10-CM

## 2022-04-29 DIAGNOSIS — E782 Mixed hyperlipidemia: Secondary | ICD-10-CM

## 2022-04-29 DIAGNOSIS — Z0000 Encounter for general adult medical examination without abnormal findings: Secondary | ICD-10-CM

## 2022-04-29 DIAGNOSIS — Z8742 Personal history of other diseases of the female genital tract: Secondary | ICD-10-CM | POA: Diagnosis not present

## 2022-04-29 DIAGNOSIS — G43709 Chronic migraine without aura, not intractable, without status migrainosus: Secondary | ICD-10-CM

## 2022-04-29 DIAGNOSIS — R7303 Prediabetes: Secondary | ICD-10-CM | POA: Diagnosis not present

## 2022-04-29 LAB — CBC WITH DIFFERENTIAL/PLATELET
Basophils Absolute: 0.1 10*3/uL (ref 0.0–0.1)
Basophils Relative: 0.7 % (ref 0.0–3.0)
Eosinophils Absolute: 0.1 10*3/uL (ref 0.0–0.7)
Eosinophils Relative: 0.6 % (ref 0.0–5.0)
HCT: 38.1 % (ref 36.0–46.0)
Hemoglobin: 12.1 g/dL (ref 12.0–15.0)
Lymphocytes Relative: 23.9 % (ref 12.0–46.0)
Lymphs Abs: 3 10*3/uL (ref 0.7–4.0)
MCHC: 31.8 g/dL (ref 30.0–36.0)
MCV: 75.7 fl — ABNORMAL LOW (ref 78.0–100.0)
Monocytes Absolute: 0.6 10*3/uL (ref 0.1–1.0)
Monocytes Relative: 5.2 % (ref 3.0–12.0)
Neutro Abs: 8.6 10*3/uL — ABNORMAL HIGH (ref 1.4–7.7)
Neutrophils Relative %: 69.6 % (ref 43.0–77.0)
Platelets: 412 10*3/uL — ABNORMAL HIGH (ref 150.0–400.0)
RBC: 5.03 Mil/uL (ref 3.87–5.11)
RDW: 17 % — ABNORMAL HIGH (ref 11.5–15.5)
WBC: 12.4 10*3/uL — ABNORMAL HIGH (ref 4.0–10.5)

## 2022-04-29 LAB — HEMOGLOBIN A1C: Hgb A1c MFr Bld: 5.9 % (ref 4.6–6.5)

## 2022-04-29 MED ORDER — SUMATRIPTAN SUCCINATE 25 MG PO TABS
ORAL_TABLET | ORAL | 0 refills | Status: DC
  Filled 2022-04-29: qty 18, 30d supply, fill #0
  Filled 2022-09-21: qty 12, 30d supply, fill #1

## 2022-04-29 NOTE — Assessment & Plan Note (Signed)
Health maintenance reviewed and updated. Discussed nutrition, exercise. Check CMP, CBC, TSH today. Follow-up 1 year.   

## 2022-04-29 NOTE — Assessment & Plan Note (Signed)
Chronic, stable. Continue imitrex as needed for migraine. Refill sent to the pharmacy. Continue drinking plenty of water.

## 2022-04-29 NOTE — Assessment & Plan Note (Signed)
BMI 38.8 associated with prediabetes and hyperlipidemia. Discussed nutrition and exercise.

## 2022-04-29 NOTE — Addendum Note (Signed)
Addended by: Alphonzo Lemmings on: 04/29/2022 09:29 AM   Modules accepted: Orders

## 2022-04-29 NOTE — Assessment & Plan Note (Signed)
Check lipid panel today. Discussed nutrition, exercise.

## 2022-04-29 NOTE — Assessment & Plan Note (Signed)
She is currently taking an over the counter supplement. Will check vitamin D and adjust regimen based on results.

## 2022-04-29 NOTE — Progress Notes (Signed)
BP 108/64 (BP Location: Left Arm)   Pulse 97   Temp 98.6 F (37 C)   Ht 5\' 3"  (1.6 m)   Wt 219 lb 6.4 oz (99.5 kg)   LMP 04/13/2022 (Exact Date)   SpO2 99%   BMI 38.86 kg/m    Subjective:    Patient ID: Becky Gilmore, female    DOB: 25-Aug-1980, 42 y.o.   MRN: [id number]  CC: Chief Complaint  Patient presents with   Annual Exam    With lab work and PAP    HPI: AMIREE SEE is a 42 y.o. female presenting on 04/29/2022 for comprehensive medical examination. Current medical complaints include:none  She currently lives with: mom and son Menopausal Symptoms: no  Depression and Anxiety Screen done today and results listed below:     04/29/2022    8:50 AM 09/26/2020    3:20 PM 07/25/2020    9:45 AM 05/13/2019   11:35 AM 05/11/2019   10:38 AM  Depression screen PHQ 2/9  Decreased Interest 0 0 0 0 0  Down, Depressed, Hopeless 0 0 0 2 0  PHQ - 2 Score 0 0 0 2 0  Altered sleeping 3 3   0  Tired, decreased energy 2 0   0  Change in appetite 0 0   0  Feeling bad or failure about yourself  0 0   0  Trouble concentrating 0 0   0  Moving slowly or fidgety/restless 0 0   0  Suicidal thoughts 0 0   0  PHQ-9 Score 5 3   0  Difficult doing work/chores     Not difficult at all      04/29/2022    8:51 AM 07/25/2020    9:45 AM 05/13/2019   11:35 AM  GAD 7 : Generalized Anxiety Score  Nervous, Anxious, on Edge 0 0 1  Control/stop worrying 0 0 0  Worry too much - different things 0 0 2  Trouble relaxing 0 0 0  Restless 0 0 0  Easily annoyed or irritable 0 0 3  Afraid - awful might happen 0 0 0  Total GAD 7 Score 0 0 6  Anxiety Difficulty   Somewhat difficult    The patient does not have a history of falls. I did not complete a risk assessment for falls. A plan of care for falls was not documented.   Past Medical History:  Past Medical History:  Diagnosis Date   Abnormal Pap smear of cervix 01/2018   cytology negative, HPV positive, repeat pap in 1 year   Migraine     Vitamin D deficiency     Surgical History:  Past Surgical History:  Procedure Laterality Date   CESAREAN SECTION     COLPOSCOPY  2021   genital warts      Medications:  Current Outpatient Medications on File Prior to Visit  Medication Sig   cholecalciferol (VITAMIN D3) 25 MCG (1000 UNIT) tablet Take 2,000 Units by mouth daily.   albuterol (VENTOLIN HFA) 108 (90 Base) MCG/ACT inhaler Inhale 2 puffs into the lungs every 6 (six) hours as needed for wheezing or shortness of breath. (Patient not taking: Reported on 04/29/2022)   No current facility-administered medications on file prior to visit.    Allergies:  No Known Allergies  Social History:  Social History   Socioeconomic History   Marital status: Single    Spouse name: Not on file   Number of children: Not on  file   Years of education: Not on file   Highest education level: Not on file  Occupational History   Not on file  Tobacco Use   Smoking status: Never   Smokeless tobacco: Never  Vaping Use   Vaping Use: Never used  Substance and Sexual Activity   Alcohol use: Yes    Alcohol/week: 4.0 standard drinks of alcohol    Types: 4 Glasses of wine per week   Drug use: Never   Sexual activity: Yes    Partners: Male    Birth control/protection: Condom  Other Topics Concern   Not on file  Social History Narrative   Not on file   Social Determinants of Health   Financial Resource Strain: Not on file  Food Insecurity: Not on file  Transportation Needs: Not on file  Physical Activity: Not on file  Stress: Not on file  Social Connections: Not on file  Intimate Partner Violence: Not on file   Social History   Tobacco Use  Smoking Status Never  Smokeless Tobacco Never   Social History   Substance and Sexual Activity  Alcohol Use Yes   Alcohol/week: 4.0 standard drinks of alcohol   Types: 4 Glasses of wine per week    Family History:  Family History  Problem Relation Age of Onset   Diabetes Mother     Breast cancer Neg Hx     Past medical history, surgical history, medications, allergies, family history and social history reviewed with patient today and changes made to appropriate areas of the chart.   Review of Systems  Constitutional:  Positive for malaise/fatigue. Negative for fever.  HENT: Negative.    Eyes: Negative.   Respiratory: Negative.    Cardiovascular: Negative.   Gastrointestinal: Negative.   Genitourinary: Negative.   Musculoskeletal: Negative.   Skin: Negative.   Neurological: Negative.   Psychiatric/Behavioral: Negative.     All other ROS negative except what is listed above and in the HPI.      Objective:    BP 108/64 (BP Location: Left Arm)   Pulse 97   Temp 98.6 F (37 C)   Ht 5\' 3"  (1.6 m)   Wt 219 lb 6.4 oz (99.5 kg)   LMP 04/13/2022 (Exact Date)   SpO2 99%   BMI 38.86 kg/m   Wt Readings from Last 3 Encounters:  04/29/22 219 lb 6.4 oz (99.5 kg)  03/10/22 215 lb (97.5 kg)  01/07/22 224 lb 1.6 oz (101.7 kg)    Physical Exam Vitals and nursing note reviewed. Exam conducted with a chaperone present.  Constitutional:      General: She is not in acute distress.    Appearance: Normal appearance. She is obese.  HENT:     Head: Normocephalic and atraumatic.     Right Ear: Tympanic membrane, ear canal and external ear normal.     Left Ear: Tympanic membrane, ear canal and external ear normal.  Eyes:     Conjunctiva/sclera: Conjunctivae normal.  Cardiovascular:     Rate and Rhythm: Normal rate and regular rhythm.     Pulses: Normal pulses.     Heart sounds: Normal heart sounds.  Pulmonary:     Effort: Pulmonary effort is normal.     Breath sounds: Normal breath sounds.  Abdominal:     Palpations: Abdomen is soft.     Tenderness: There is no abdominal tenderness.  Genitourinary:    General: Normal vulva.     Exam position: Lithotomy position.  Labia:        Right: No rash, tenderness or lesion.        Left: No rash, tenderness or lesion.       Vagina: Normal.     Cervix: Normal.     Uterus: Normal.      Adnexa: Right adnexa normal and left adnexa normal.  Musculoskeletal:        General: Normal range of motion.     Cervical back: Normal range of motion and neck supple.     Right lower leg: No edema.     Left lower leg: No edema.  Lymphadenopathy:     Cervical: No cervical adenopathy.  Skin:    General: Skin is warm and dry.  Neurological:     General: No focal deficit present.     Mental Status: She is alert and oriented to person, place, and time.     Cranial Nerves: No cranial nerve deficit.     Coordination: Coordination normal.     Gait: Gait normal.  Psychiatric:        Mood and Affect: Mood normal.        Behavior: Behavior normal.        Thought Content: Thought content normal.        Judgment: Judgment normal.     Results for orders placed or performed during the hospital encounter of 03/10/22  SARS CORONAVIRUS 2 (TAT 6-24 HRS) Anterior Nasal Swab   Specimen: Anterior Nasal Swab  Result Value Ref Range   SARS Coronavirus 2 POSITIVE (A) NEGATIVE      Assessment & Plan:   Problem List Items Addressed This Visit       Cardiovascular and Mediastinum   Chronic migraine without aura without status migrainosus, not intractable    Chronic, stable. Continue imitrex as needed for migraine. Refill sent to the pharmacy. Continue drinking plenty of water.       Relevant Medications   SUMAtriptan (IMITREX) 25 MG tablet     Other   Vitamin D deficiency    She is currently taking an over the counter supplement. Will check vitamin D and adjust regimen based on results.       Relevant Orders   VITAMIN D 25 Hydroxy (Vit-D Deficiency, Fractures)   Hyperlipidemia    Check lipid panel today. Discussed nutrition, exercise.       Relevant Orders   CBC with Differential/Platelet   Comprehensive metabolic panel   Lipid panel   Morbid obesity (HCC)    BMI 38.8 associated with prediabetes and  hyperlipidemia. Discussed nutrition and exercise.       Prediabetes    Check A1c today and treat based on results.       Relevant Orders   Hemoglobin A1c   Routine general medical examination at a health care facility - Primary    Health maintenance reviewed and updated. Discussed nutrition, exercise. Check CMP, CBC, TSH today. Follow-up 1 year.        Relevant Orders   CBC with Differential/Platelet   Comprehensive metabolic panel   TSH   Other Visit Diagnoses     Screening for cervical cancer       pap with HPV done today. Last year pap showed ASCUS with negative HPV.   Relevant Orders   Cytology - PAP   History of abnormal cervical Pap smear       Check pap with HPV   Relevant Orders   Cytology - PAP   Screening for  HIV (human immunodeficiency virus)       Screen HIV today   Relevant Orders   HIV Antibody (routine testing w rflx)        Follow up plan: Return in about 1 year (around 04/29/2023) for CPE.   LABORATORY TESTING:  - Pap smear: pap done  IMMUNIZATIONS:   - Tdap: Tetanus vaccination status reviewed: last tetanus booster within 10 years. - Influenza: Up to date - Pneumovax: Not applicable - Prevnar: Not applicable - HPV: Not applicable - Zostavax vaccine: Not applicable  SCREENING: -Mammogram: Up to date  - Colonoscopy: Not applicable  - Bone Density: Not applicable  -Hearing Test: Not applicable  -Spirometry: Not applicable   PATIENT COUNSELING:   Advised to take 1 mg of folate supplement per day if capable of pregnancy.   Sexuality: Discussed sexually transmitted diseases, partner selection, use of condoms, avoidance of unintended pregnancy  and contraceptive alternatives.   Advised to avoid cigarette smoking.  I discussed with the patient that most people either abstain from alcohol or drink within safe limits (<=14/week and <=4 drinks/occasion for males, <=7/weeks and <= 3 drinks/occasion for females) and that the risk for alcohol  disorders and other health effects rises proportionally with the number of drinks per week and how often a drinker exceeds daily limits.  Discussed cessation/primary prevention of drug use and availability of treatment for abuse.   Diet: Encouraged to adjust caloric intake to maintain  or achieve ideal body weight, to reduce intake of dietary saturated fat and total fat, to limit sodium intake by avoiding high sodium foods and not adding table salt, and to maintain adequate dietary potassium and calcium preferably from fresh fruits, vegetables, and low-fat dairy products.    stressed the importance of regular exercise  Injury prevention: Discussed safety belts, safety helmets, smoke detector, smoking near bedding or upholstery.   Dental health: Discussed importance of regular tooth brushing, flossing, and dental visits.    NEXT PREVENTATIVE PHYSICAL DUE IN 1 YEAR. Return in about 1 year (around 04/29/2023) for CPE.

## 2022-04-29 NOTE — Patient Instructions (Signed)
It was great to see you!  We are checking your labs today and will let you know the results via mychart/phone.   Let's follow-up in 1 year, sooner if you have concerns.  If a referral was placed today, you will be contacted for an appointment. Please note that routine referrals can sometimes take up to 3-4 weeks to process. Please call our office if you haven't heard anything after this time frame.  Take care,  Valin Massie, NP  

## 2022-04-29 NOTE — Assessment & Plan Note (Signed)
Check A1c today and treat based on results.  

## 2022-04-30 ENCOUNTER — Other Ambulatory Visit: Payer: Self-pay | Admitting: Family

## 2022-04-30 DIAGNOSIS — N63 Unspecified lump in unspecified breast: Secondary | ICD-10-CM

## 2022-04-30 DIAGNOSIS — R928 Other abnormal and inconclusive findings on diagnostic imaging of breast: Secondary | ICD-10-CM

## 2022-05-01 LAB — CYTOLOGY - PAP
Adequacy: ABSENT
Chlamydia: NEGATIVE
Comment: NEGATIVE
Comment: NEGATIVE
Comment: NEGATIVE
Comment: NORMAL
Diagnosis: NEGATIVE
High risk HPV: NEGATIVE
Neisseria Gonorrhea: NEGATIVE
Trichomonas: NEGATIVE

## 2022-05-28 ENCOUNTER — Telehealth: Payer: Self-pay | Admitting: Nurse Practitioner

## 2022-05-28 NOTE — Telephone Encounter (Signed)
I called and spoke with patient and notified her that an appointment is needed and spoke with Lauren and she Ok'd a VV at 1:00pm on 05-29-22.

## 2022-05-28 NOTE — Telephone Encounter (Signed)
Pt called and want to know if you can call her medication for a yeast infection or do she need to schedule an appointment. Please call the pt

## 2022-05-29 ENCOUNTER — Telehealth (INDEPENDENT_AMBULATORY_CARE_PROVIDER_SITE_OTHER): Payer: BC Managed Care – PPO | Admitting: Nurse Practitioner

## 2022-05-29 ENCOUNTER — Encounter: Payer: Self-pay | Admitting: Nurse Practitioner

## 2022-05-29 ENCOUNTER — Other Ambulatory Visit (HOSPITAL_COMMUNITY): Payer: Self-pay

## 2022-05-29 VITALS — Ht 63.0 in | Wt 220.0 lb

## 2022-05-29 DIAGNOSIS — B379 Candidiasis, unspecified: Secondary | ICD-10-CM | POA: Diagnosis not present

## 2022-05-29 MED ORDER — FLUCONAZOLE 150 MG PO TABS
ORAL_TABLET | ORAL | 0 refills | Status: DC
  Filled 2022-05-29: qty 2, 3d supply, fill #0

## 2022-05-29 NOTE — Progress Notes (Signed)
[facility] [address] [place] [address] Dept: [phone] Dept Fax: [phone]  Virtual Video Visit  I connected with Becky Gilmore on 05/29/22 at  1:00 PM EDT by a video enabled telemedicine application and verified that I am speaking with the correct person using two identifiers.  Location patient: Home Location provider: Clinic Persons participating in the virtual visit: Patient; Rodman Pickle, NP; Malena Peer, CMA  I discussed the limitations of evaluation and management by telemedicine and the availability of in person appointments. The patient expressed understanding and agreed to proceed.  Chief Complaint  Patient presents with   Vaginitis    Recently taken antibiotics and symptoms started on Sunday    SUBJECTIVE:  HPI: Becky Gilmore is a 42 y.o. female who presents with vaginal itching and irritation. She states that last week she started antibiotics for a tooth ache. She has been using cool compresses during her showers which has helped some. She denies dysuria, but does endorse urinary frequency. She denies fevers.   Patient Active Problem List   Diagnosis Date Noted   Prediabetes 04/29/2022   Routine general medical examination at a health care facility 04/29/2022   Abnormal Papanicolaou smear of cervix with positive human papilloma virus (HPV) test 04/17/2021   Chronic migraine without aura without status migrainosus, not intractable 04/10/2021   Hyperlipidemia 07/26/2020   Morbid obesity (HCC) 07/26/2020   Vitamin D deficiency 07/25/2020   SOB (shortness of breath) on exertion 07/25/2020    Past Surgical History:  Procedure Laterality Date   CESAREAN SECTION     COLPOSCOPY  2021   genital warts      Family History  Problem Relation Age of Onset   Diabetes Mother    Breast cancer Neg Hx     Social History   Tobacco Use   Smoking status: Never   Smokeless tobacco:  Never  Vaping Use   Vaping Use: Never used  Substance Use Topics   Alcohol use: Yes    Alcohol/week: 4.0 standard drinks of alcohol    Types: 4 Glasses of wine per week   Drug use: Never     Current Outpatient Medications:    cholecalciferol (VITAMIN D3) 25 MCG (1000 UNIT) tablet, Take 2,000 Units by mouth daily., Disp: , Rfl:    fluconazole (DIFLUCAN) 150 MG tablet, Take 1 tablet today and then 1 tablet on Sunday, Disp: 2 tablet, Rfl: 0   albuterol (VENTOLIN HFA) 108 (90 Base) MCG/ACT inhaler, Inhale 2 puffs into the lungs every 6 (six) hours as needed for wheezing or shortness of breath. (Patient not taking: Reported on 04/29/2022), Disp: 8.5 g, Rfl: 0   SUMAtriptan (IMITREX) 25 MG tablet, Take 25 mg (1 tablet total) by mouth at the start of the headache. May repeat in 2 hours x 1 if headache persists. Max of 2 tabs/24 hours (Patient not taking: Reported on 05/29/2022), Disp: 30 tablet, Rfl: 0  No Known Allergies  ROS: See pertinent positives and negatives per HPI.  OBSERVATIONS/OBJECTIVE:  VITALS per patient if applicable: Today's Vitals   05/29/22 1300  Weight: 220 lb (99.8 kg)  Height:  (1.6 m)   Body mass index is 38.97 kg/m.    GENERAL: Alert and oriented. Appears well and in no acute distress.  HEENT: Atraumatic. Conjunctiva clear. No obvious abnormalities on inspection of external nose and ears.  NECK: Normal movements of the head and neck.  LUNGS: On inspection, no signs of  respiratory distress. Breathing rate appears normal. No obvious gross SOB, gasping or wheezing, and no conversational dyspnea.  CV: No obvious cyanosis.  MS: Moves all visible extremities without noticeable abnormality.  PSYCH/NEURO: Pleasant and cooperative. No obvious depression or anxiety. Speech and thought processing grossly intact.  ASSESSMENT AND PLAN:  Problem List Items Addressed This Visit   None Visit Diagnoses     Yeast infection    -  Primary   Start diflucan  x1  today, then second dose in 3 days. Continue cool compresses as needed. F/U if not improving.   Relevant Medications   fluconazole (DIFLUCAN) 150 MG tablet        I discussed the assessment and treatment plan with the patient. The patient was provided an opportunity to ask questions and all were answered. The patient agreed with the plan and demonstrated an understanding of the instructions.   The patient was advised to call back or seek an in-person evaluation if the symptoms worsen or if the condition fails to improve as anticipated.   Gerre Scull, NP

## 2022-05-29 NOTE — Patient Instructions (Signed)
It was great to see you!  Start diflucan 1 tablet today and a second tablet in 3 days, on Sunday.   Let's follow-up if your symptoms worsen or don't improve.   Take care,  Rodman Pickle, NP

## 2022-07-03 ENCOUNTER — Other Ambulatory Visit: Payer: Medicaid Other

## 2022-07-23 ENCOUNTER — Ambulatory Visit
Admission: RE | Admit: 2022-07-23 | Discharge: 2022-07-23 | Disposition: A | Discharge status: Home / Self Care | Payer: BC Managed Care – PPO | Source: Ambulatory Visit | Attending: Family | Admitting: Family

## 2022-07-23 ENCOUNTER — Ambulatory Visit
Admission: RE | Admit: 2022-07-23 | Discharge: 2022-07-23 | Disposition: A | Discharge status: Home / Self Care | Payer: Medicaid Other | Source: Ambulatory Visit | Attending: Family | Admitting: Family

## 2022-07-23 DIAGNOSIS — N63 Unspecified lump in unspecified breast: Secondary | ICD-10-CM

## 2022-07-23 DIAGNOSIS — N6323 Unspecified lump in the left breast, lower outer quadrant: Secondary | ICD-10-CM | POA: Diagnosis not present

## 2022-07-23 DIAGNOSIS — R928 Other abnormal and inconclusive findings on diagnostic imaging of breast: Secondary | ICD-10-CM

## 2022-09-22 ENCOUNTER — Other Ambulatory Visit (HOSPITAL_COMMUNITY): Payer: Self-pay

## 2023-05-05 ENCOUNTER — Encounter: Payer: BC Managed Care – PPO | Admitting: Nurse Practitioner

## 2023-05-05 ENCOUNTER — Other Ambulatory Visit (HOSPITAL_COMMUNITY): Payer: Self-pay

## 2023-05-05 MED ORDER — AMOXICILLIN 500 MG PO CAPS
500.0000 mg | ORAL_CAPSULE | Freq: Three times a day (TID) | ORAL | 0 refills | Status: DC
  Filled 2023-05-05: qty 21, 7d supply, fill #0

## 2023-07-06 ENCOUNTER — Ambulatory Visit: Payer: Self-pay

## 2023-07-10 ENCOUNTER — Encounter: Payer: Self-pay | Admitting: Nurse Practitioner

## 2023-07-10 ENCOUNTER — Other Ambulatory Visit: Payer: Self-pay | Admitting: Nurse Practitioner

## 2023-07-10 DIAGNOSIS — N63 Unspecified lump in unspecified breast: Secondary | ICD-10-CM

## 2023-07-17 ENCOUNTER — Encounter: Payer: Self-pay | Admitting: Nurse Practitioner

## 2023-07-17 ENCOUNTER — Other Ambulatory Visit (HOSPITAL_COMMUNITY)
Admission: RE | Admit: 2023-07-17 | Discharge: 2023-07-17 | Disposition: A | Discharge status: Home / Self Care | Source: Ambulatory Visit | Attending: Nurse Practitioner | Admitting: Nurse Practitioner

## 2023-07-17 ENCOUNTER — Ambulatory Visit (INDEPENDENT_AMBULATORY_CARE_PROVIDER_SITE_OTHER): Admitting: Nurse Practitioner

## 2023-07-17 VITALS — BP 116/72 | HR 92 | Temp 97.3°F | Ht 63.0 in | Wt 215.6 lb

## 2023-07-17 DIAGNOSIS — Z113 Encounter for screening for infections with a predominantly sexual mode of transmission: Secondary | ICD-10-CM | POA: Diagnosis present

## 2023-07-17 DIAGNOSIS — E559 Vitamin D deficiency, unspecified: Secondary | ICD-10-CM

## 2023-07-17 DIAGNOSIS — R7303 Prediabetes: Secondary | ICD-10-CM | POA: Diagnosis not present

## 2023-07-17 DIAGNOSIS — G43709 Chronic migraine without aura, not intractable, without status migrainosus: Secondary | ICD-10-CM

## 2023-07-17 DIAGNOSIS — Z0000 Encounter for general adult medical examination without abnormal findings: Secondary | ICD-10-CM | POA: Diagnosis not present

## 2023-07-17 DIAGNOSIS — E782 Mixed hyperlipidemia: Secondary | ICD-10-CM

## 2023-07-17 NOTE — Assessment & Plan Note (Signed)
 Chronic, stable. Continue imitrex  as needed for migraine.

## 2023-07-17 NOTE — Assessment & Plan Note (Signed)
Health maintenance reviewed and updated. Discussed nutrition, exercise. Follow-up 1 year.

## 2023-07-17 NOTE — Assessment & Plan Note (Signed)
She is currently taking an over the counter supplement. Will check vitamin D and adjust regimen based on results.

## 2023-07-17 NOTE — Progress Notes (Signed)
 BP 116/72 (BP Location: Left Arm, Patient Position: Sitting, Cuff Size: Normal)   Pulse 92   Temp (!) 97.3 F (36.3 C)   Ht 5' 3 (1.6 m)   Wt 215 lb 9.6 oz (97.8 kg)   LMP 07/09/2023 (Exact Date)   SpO2 98%   BMI 38.19 kg/m    Subjective:    Patient ID: Becky Gilmore, female    DOB: 1980-03-31, 43 y.o.   MRN: [id number]  CC: Chief Complaint  Patient presents with   Annual Exam    With lab work-patient is not fasting, request to check vitamin d  level    HPI: Becky Gilmore is a 43 y.o. female presenting on 07/17/2023 for comprehensive medical examination. Current medical complaints include:none  She currently lives with: son  Depression and Anxiety Screen done today and results listed below:     07/17/2023   10:25 AM 04/29/2022    8:50 AM 09/26/2020    3:20 PM 07/25/2020    9:45 AM 05/13/2019   11:35 AM  Depression screen PHQ 2/9  Decreased Interest 0 0 0 0 0  Down, Depressed, Hopeless 0 0 0 0 2  PHQ - 2 Score 0 0 0 0 2  Altered sleeping 3 3 3     Tired, decreased energy 1 2 0    Change in appetite 0 0 0    Feeling bad or failure about yourself  0 0 0    Trouble concentrating 0 0 0    Moving slowly or fidgety/restless 0 0 0    Suicidal thoughts 0 0 0    PHQ-9 Score 4 5 3     Difficult doing work/chores Not difficult at all          07/17/2023   10:25 AM 04/29/2022    8:51 AM 07/25/2020    9:45 AM 05/13/2019   11:35 AM  GAD 7 : Generalized Anxiety Score  Nervous, Anxious, on Edge 0 0 0 1  Control/stop worrying 0 0 0 0  Worry too much - different things 1 0 0 2  Trouble relaxing 0 0 0 0  Restless 0 0 0 0  Easily annoyed or irritable 0 0 0 3  Afraid - awful might happen 0 0 0 0  Total GAD 7 Score 1 0 0 6  Anxiety Difficulty Not difficult at all   Somewhat difficult    The patient does not have a history of falls. I did not complete a risk assessment for falls. A plan of care for falls was not documented.   Past Medical History:  Past Medical History:   Diagnosis Date   Abnormal Pap smear of cervix 01/2018   cytology negative, HPV positive, repeat pap in 1 year   Migraine    Vitamin D  deficiency     Surgical History:  Past Surgical History:  Procedure Laterality Date   CESAREAN SECTION     COLPOSCOPY  2021   genital warts      Medications:  Current Outpatient Medications on File Prior to Visit  Medication Sig   cholecalciferol (VITAMIN D3) 25 MCG (1000 UNIT) tablet Take 2,000 Units by mouth daily. (Patient not taking: Reported on 07/17/2023)   SUMAtriptan  (IMITREX ) 25 MG tablet Take 25 mg (1 tablet total) by mouth at the start of the headache. May repeat in 2 hours x 1 if headache persists. Max of 2 tabs/24 hours (Patient not taking: Reported on 07/17/2023)   No current facility-administered medications on file prior  to visit.    Allergies:  No Known Allergies  Social History:  Social History   Socioeconomic History   Marital status: Single    Spouse name: Not on file   Number of children: Not on file   Years of education: Not on file   Highest education level: Not on file  Occupational History   Not on file  Tobacco Use   Smoking status: Never   Smokeless tobacco: Never  Vaping Use   Vaping status: Never Used  Substance and Sexual Activity   Alcohol use: Yes    Alcohol/week: 4.0 standard drinks of alcohol    Types: 4 Glasses of wine per week   Drug use: Never   Sexual activity: Yes    Partners: Male    Birth control/protection: Condom  Other Topics Concern   Not on file  Social History Narrative   Not on file   Social Drivers of Health   Financial Resource Strain: Not on file  Food Insecurity: Not on file  Transportation Needs: Not on file  Physical Activity: Not on file  Stress: Not on file  Social Connections: Not on file  Intimate Partner Violence: Not on file   Social History   Tobacco Use  Smoking Status Never  Smokeless Tobacco Never   Social History   Substance and Sexual Activity   Alcohol Use Yes   Alcohol/week: 4.0 standard drinks of alcohol   Types: 4 Glasses of wine per week    Family History:  Family History  Problem Relation Age of Onset   Diabetes Mother    Breast cancer Neg Hx     Past medical history, surgical history, medications, allergies, family history and social history reviewed with patient today and changes made to appropriate areas of the chart.   Review of Systems  Constitutional:  Positive for malaise/fatigue. Negative for fever.       Brittle hair  HENT: Negative.    Eyes: Negative.   Respiratory: Negative.    Cardiovascular: Negative.   Gastrointestinal: Negative.   Genitourinary: Negative.   Musculoskeletal: Negative.   Skin: Negative.   Neurological: Negative.   Psychiatric/Behavioral: Negative.     All other ROS negative except what is listed above and in the HPI.      Objective:    BP 116/72 (BP Location: Left Arm, Patient Position: Sitting, Cuff Size: Normal)   Pulse 92   Temp (!) 97.3 F (36.3 C)   Ht 5' 3 (1.6 m)   Wt 215 lb 9.6 oz (97.8 kg)   LMP 07/09/2023 (Exact Date)   SpO2 98%   BMI 38.19 kg/m   Wt Readings from Last 3 Encounters:  07/17/23 215 lb 9.6 oz (97.8 kg)  05/29/22 220 lb (99.8 kg)  04/29/22 219 lb 6.4 oz (99.5 kg)    Physical Exam Vitals and nursing note reviewed.  Constitutional:      General: She is not in acute distress.    Appearance: Normal appearance.  HENT:     Head: Normocephalic and atraumatic.     Right Ear: Tympanic membrane, ear canal and external ear normal.     Left Ear: Tympanic membrane, ear canal and external ear normal.     Mouth/Throat:     Mouth: Mucous membranes are moist.     Pharynx: No posterior oropharyngeal erythema.   Eyes:     Conjunctiva/sclera: Conjunctivae normal.    Cardiovascular:     Rate and Rhythm: Normal rate and regular rhythm.  Pulses: Normal pulses.     Heart sounds: Normal heart sounds.  Pulmonary:     Effort: Pulmonary effort is  normal.     Breath sounds: Normal breath sounds.  Abdominal:     Palpations: Abdomen is soft.     Tenderness: There is no abdominal tenderness.   Musculoskeletal:        General: Normal range of motion.     Cervical back: Normal range of motion and neck supple.     Right lower leg: No edema.     Left lower leg: No edema.  Lymphadenopathy:     Cervical: No cervical adenopathy.   Skin:    General: Skin is warm and dry.   Neurological:     General: No focal deficit present.     Mental Status: She is alert and oriented to person, place, and time.     Cranial Nerves: No cranial nerve deficit.     Coordination: Coordination normal.     Gait: Gait normal.   Psychiatric:        Mood and Affect: Mood normal.        Behavior: Behavior normal.        Thought Content: Thought content normal.        Judgment: Judgment normal.     Results for orders placed or performed in visit on 04/29/22  Cytology - PAP   Collection Time: 04/29/22  8:44 AM  Result Value Ref Range   High risk HPV Negative    Neisseria Gonorrhea Negative    Chlamydia Negative    Trichomonas Negative    Adequacy      Satisfactory for evaluation; transformation zone component ABSENT.   Diagnosis      - Negative for intraepithelial lesion or malignancy (NILM)   Comment Normal Reference Ranger Chlamydia - Negative    Comment      Normal Reference Range Neisseria Gonorrhea - Negative   Comment Normal Reference Range HPV - Negative    Comment Normal Reference Range Trichomonas - Negative   CBC with Differential/Platelet   Collection Time: 04/29/22  8:55 AM  Result Value Ref Range   WBC 12.4 (H) 4.0 - 10.5 K/uL   RBC 5.03 3.87 - 5.11 Mil/uL   Hemoglobin 12.1 12.0 - 15.0 g/dL   HCT 95.6 21.3 - 08.6 %   MCV 75.7 (L) 78.0 - 100.0 fl   MCHC 31.8 30.0 - 36.0 g/dL   RDW 57.8 (H) 46.9 - 62.9 %   Platelets 412.0 (H) 150.0 - 400.0 K/uL   Neutrophils Relative % 69.6 43.0 - 77.0 %   Lymphocytes Relative 23.9 12.0 - 46.0  %   Monocytes Relative 5.2 3.0 - 12.0 %   Eosinophils Relative 0.6 0.0 - 5.0 %   Basophils Relative 0.7 0.0 - 3.0 %   Neutro Abs 8.6 (H) 1.4 - 7.7 K/uL   Lymphs Abs 3.0 0.7 - 4.0 K/uL   Monocytes Absolute 0.6 0.1 - 1.0 K/uL   Eosinophils Absolute 0.1 0.0 - 0.7 K/uL   Basophils Absolute 0.1 0.0 - 0.1 K/uL  Hemoglobin A1c   Collection Time: 04/29/22  8:55 AM  Result Value Ref Range   Hgb A1c MFr Bld 5.9 4.6 - 6.5 %      Assessment & Plan:   Problem List Items Addressed This Visit       Cardiovascular and Mediastinum   Chronic migraine without aura without status migrainosus, not intractable   Chronic, stable. Continue imitrex  as needed for  migraine.         Other   Vitamin D  deficiency   She is currently taking an over the counter supplement. Will check vitamin D  and adjust regimen based on results.       Relevant Orders   VITAMIN D  25 Hydroxy (Vit-D Deficiency, Fractures)   Hyperlipidemia   Check lipid panel today. Discussed nutrition, exercise.       Relevant Orders   CBC with Differential/Platelet   Comprehensive metabolic panel with GFR   Lipid panel   Morbid obesity (HCC)   BMI 38.1 associated with prediabetes and hyperlipidemia. Discussed nutrition and exercise. Check TSH today.       Relevant Orders   TSH   Prediabetes   Check A1c today and treat based on results.       Relevant Orders   Comprehensive metabolic panel with GFR   TSH   Hemoglobin A1c   Routine general medical examination at a health care facility - Primary   Health maintenance reviewed and updated. Discussed nutrition, exercise. Follow-up 1 year.        Other Visit Diagnoses       Screen for STD (sexually transmitted disease)       Relevant Orders   HIV Antibody (routine testing w rflx)   Urine cytology ancillary only        Follow up plan: Return in about 1 year (around 07/16/2024) for CPE.   LABORATORY TESTING:  - Pap smear: done elsewhere  IMMUNIZATIONS:   - Tdap:  Tetanus vaccination status reviewed: last tetanus booster within 10 years. - Influenza: Postponed to flu season - Pneumovax: Not applicable - Prevnar: Not applicable - HPV: Not applicable - Shingrix vaccine: Not applicable  SCREENING: -Mammogram: scheduled 08/05/23  - Colonoscopy: Not applicable  - Bone Density: Not applicable   PATIENT COUNSELING:   Advised to take 1 mg of folate supplement per day if capable of pregnancy.   Sexuality: Discussed sexually transmitted diseases, partner selection, use of condoms, avoidance of unintended pregnancy  and contraceptive alternatives.   Advised to avoid cigarette smoking.  I discussed with the patient that most people either abstain from alcohol or drink within safe limits (<=14/week and <=4 drinks/occasion for males, <=7/weeks and <= 3 drinks/occasion for females) and that the risk for alcohol disorders and other health effects rises proportionally with the number of drinks per week and how often a drinker exceeds daily limits.  Discussed cessation/primary prevention of drug use and availability of treatment for abuse.   Diet: Encouraged to adjust caloric intake to maintain  or achieve ideal body weight, to reduce intake of dietary saturated fat and total fat, to limit sodium intake by avoiding high sodium foods and not adding table salt, and to maintain adequate dietary potassium and calcium preferably from fresh fruits, vegetables, and low-fat dairy products.    stressed the importance of regular exercise  Injury prevention: Discussed safety belts, safety helmets, smoke detector, smoking near bedding or upholstery.   Dental health: Discussed importance of regular tooth brushing, flossing, and dental visits.    NEXT PREVENTATIVE PHYSICAL DUE IN 1 YEAR. Return in about 1 year (around 07/16/2024) for CPE.  Akina Maish A Aylana Hirschfeld

## 2023-07-17 NOTE — Assessment & Plan Note (Signed)
Check lipid panel today. Discussed nutrition, exercise.  

## 2023-07-17 NOTE — Patient Instructions (Signed)
It was great to see you!  We are checking your labs today and will let you know the results via mychart/phone.   Keep up the great work!   Let's follow-up in 1 year, sooner if you have concerns.  If a referral was placed today, you will be contacted for an appointment. Please note that routine referrals can sometimes take up to 3-4 weeks to process. Please call our office if you haven't heard anything after this time frame.  Take care,  Nivin Braniff, NP  

## 2023-07-17 NOTE — Assessment & Plan Note (Signed)
 BMI 38.1 associated with prediabetes and hyperlipidemia. Discussed nutrition and exercise. Check TSH today.

## 2023-07-17 NOTE — Assessment & Plan Note (Signed)
Check A1c today and treat based on results.  

## 2023-07-18 LAB — COMPREHENSIVE METABOLIC PANEL WITH GFR
AG Ratio: 1.5 (calc) (ref 1.0–2.5)
ALT: 12 U/L (ref 6–29)
AST: 12 U/L (ref 10–30)
Albumin: 4.2 g/dL (ref 3.6–5.1)
Alkaline phosphatase (APISO): 90 U/L (ref 31–125)
BUN: 12 mg/dL (ref 7–25)
CO2: 23 mmol/L (ref 20–32)
Calcium: 9.1 mg/dL (ref 8.6–10.2)
Chloride: 104 mmol/L (ref 98–110)
Creat: 0.86 mg/dL (ref 0.50–0.99)
Globulin: 2.8 g/dL (ref 1.9–3.7)
Glucose, Bld: 83 mg/dL (ref 65–99)
Potassium: 4.2 mmol/L (ref 3.5–5.3)
Sodium: 139 mmol/L (ref 135–146)
Total Bilirubin: 0.4 mg/dL (ref 0.2–1.2)
Total Protein: 7 g/dL (ref 6.1–8.1)
eGFR: 86 mL/min/{1.73_m2} (ref >=60)

## 2023-07-18 LAB — LIPID PANEL
Cholesterol: 253 mg/dL — ABNORMAL HIGH (ref <200)
HDL: 56 mg/dL (ref >=50)
LDL Cholesterol (Calc): 175 mg/dL — ABNORMAL HIGH
Non-HDL Cholesterol (Calc): 197 mg/dL — ABNORMAL HIGH (ref <130)
Total CHOL/HDL Ratio: 4.5 (calc) (ref <5.0)
Triglycerides: 102 mg/dL (ref <150)

## 2023-07-18 LAB — CBC WITH DIFFERENTIAL/PLATELET
Absolute Lymphocytes: 2410 {cells}/uL (ref 850–3900)
Absolute Monocytes: 433 {cells}/uL (ref 200–950)
Basophils Absolute: 52 {cells}/uL (ref 0–200)
Basophils Relative: 0.5 %
Eosinophils Absolute: 82 {cells}/uL (ref 15–500)
Eosinophils Relative: 0.8 %
HCT: 39.7 % (ref 35.0–45.0)
Hemoglobin: 11.9 g/dL (ref 11.7–15.5)
MCH: 23.3 pg — ABNORMAL LOW (ref 27.0–33.0)
MCHC: 30 g/dL — ABNORMAL LOW (ref 32.0–36.0)
MCV: 77.7 fL — ABNORMAL LOW (ref 80.0–100.0)
MPV: 10.5 fL (ref 7.5–12.5)
Monocytes Relative: 4.2 %
Neutro Abs: 7323 {cells}/uL (ref 1500–7800)
Neutrophils Relative %: 71.1 %
Platelets: 446 10*3/uL — ABNORMAL HIGH (ref 140–400)
RBC: 5.11 10*6/uL — ABNORMAL HIGH (ref 3.80–5.10)
RDW: 15 % (ref 11.0–15.0)
Total Lymphocyte: 23.4 %
WBC: 10.3 10*3/uL (ref 3.8–10.8)

## 2023-07-18 LAB — HEMOGLOBIN A1C
Hgb A1c MFr Bld: 5.8 % — ABNORMAL HIGH (ref <5.7)
Mean Plasma Glucose: 120 mg/dL
eAG (mmol/L): 6.6 mmol/L

## 2023-07-18 LAB — HIV ANTIBODY (ROUTINE TESTING W REFLEX): HIV 1&2 Ab, 4th Generation: NONREACTIVE

## 2023-07-18 LAB — VITAMIN D 25 HYDROXY (VIT D DEFICIENCY, FRACTURES): Vit D, 25-Hydroxy: 32 ng/mL (ref 30–100)

## 2023-07-18 LAB — TSH: TSH: 1.89 m[IU]/L

## 2023-07-20 ENCOUNTER — Ambulatory Visit: Payer: Self-pay | Admitting: Nurse Practitioner

## 2023-07-20 LAB — URINE CYTOLOGY ANCILLARY ONLY
Chlamydia: NEGATIVE
Comment: NEGATIVE
Comment: NORMAL
Neisseria Gonorrhea: NEGATIVE

## 2023-07-22 ENCOUNTER — Encounter: Admitting: Nurse Practitioner

## 2023-08-05 ENCOUNTER — Encounter

## 2023-08-05 ENCOUNTER — Other Ambulatory Visit

## 2023-08-26 ENCOUNTER — Ambulatory Visit
Admission: RE | Admit: 2023-08-26 | Discharge: 2023-08-26 | Disposition: A | Discharge status: Home / Self Care | Source: Ambulatory Visit | Attending: Nurse Practitioner | Admitting: Nurse Practitioner

## 2023-08-26 DIAGNOSIS — N63 Unspecified lump in unspecified breast: Secondary | ICD-10-CM

## 2023-08-27 ENCOUNTER — Ambulatory Visit: Payer: Self-pay | Admitting: Nurse Practitioner

## 2023-09-01 ENCOUNTER — Ambulatory Visit
Admission: RE | Admit: 2023-09-01 | Discharge: 2023-09-01 | Disposition: A | Discharge status: Home / Self Care | Source: Ambulatory Visit | Attending: Nurse Practitioner | Admitting: Nurse Practitioner

## 2023-09-02 ENCOUNTER — Other Ambulatory Visit: Payer: Self-pay | Admitting: Nurse Practitioner

## 2023-09-02 DIAGNOSIS — N6320 Unspecified lump in the left breast, unspecified quadrant: Secondary | ICD-10-CM

## 2023-09-04 ENCOUNTER — Ambulatory Visit
Admission: RE | Admit: 2023-09-04 | Discharge: 2023-09-04 | Disposition: A | Discharge status: Home / Self Care | Source: Ambulatory Visit | Attending: Nurse Practitioner | Admitting: Nurse Practitioner

## 2023-09-04 DIAGNOSIS — N6320 Unspecified lump in the left breast, unspecified quadrant: Secondary | ICD-10-CM

## 2023-09-04 HISTORY — PX: BREAST BIOPSY: SHX20

## 2023-09-07 LAB — SURGICAL PATHOLOGY

## 2023-09-13 ENCOUNTER — Ambulatory Visit

## 2023-09-30 ENCOUNTER — Ambulatory Visit: Payer: Self-pay

## 2023-09-30 NOTE — Telephone Encounter (Signed)
 FYI Only or Action Required?: FYI only for provider.  Patient was last seen in primary care on 07/17/2023 by Becky Tinnie LABOR, NP.  Called Nurse Triage reporting Breast Pain.  Symptoms began a week ago.  Interventions attempted: Other: cleaning the area, Neosporin and bandage.  Symptoms are: gradually worsening.  Triage Disposition: See Physician Within 24 Hours  Patient/caregiver understands and will follow disposition?: yes         Copied from CRM #[id number]. Topic: Clinical - Red Word Triage >> Sep 30, 2023  9:08 AM [place] A wrote: [facility] that prompted transfer to Nurse Triage: Patient had Biopsy on breast spot was healing but now it is tender and painful., has a discharge and seem to not be healing. Reason for Disposition . [1] Looks infected (e.g., spreading redness, pus) AND [2] no fever  Answer Assessment - Initial Assessment Questions 1. SYMPTOM: What's the main symptom you're concerned about?  (e.g., lump, nipple discharge, pain, rash)     Biopsy sire on breast draining yellow discharge and inflamed 2. LOCATION: Where is the drainage located?     Left breast 3. ONSET: When did sx  start?     Last week  4. PRIOR HISTORY: Do you have any history of prior problems with your breasts? (e.g., breast cancer, breast implant, fibrocystic breast disease)     no 5. CAUSE: What do you think is causing this symptom?     infection 6. OTHER SYMPTOMS: Do you have any other symptoms? (e.g., breast pain, fever, nipple discharge, redness or rash)     Mild pain 3/10  Answer Assessment - Initial Assessment Questions 1. LOCATION: Where is the wound located?      Left breast 2. WOUND APPEARANCE: What does the wound look like?      inflamed 3. SIZE: If redness is present, ask: What is the size of the red area? (Inches, centimeters, or compare to size of a coin)      Pt could not quantify 4. SPREAD: What's changed in the last day?  Do you see any red streaks  coming from the wound?     ? Purulent drainage and swollen  5. ONSET: When did it start to look infected?      Last week 6. MECHANISM: How did the wound start, what was the cause?     Bx was done  7. PAIN: Do you have any pain?  If Yes, ask: How bad is the pain?  (e.g., Scale 1-10; mild, moderate, or severe)     3 8. FEVER: Do you have a fever? If Yes, ask: What is your temperature, how was it measured, and when did it start?     no 9. OTHER SYMPTOMS: Do you have any other symptoms? (e.g., shaking chills, weakness, rash elsewhere on body)     no  Protocols used: Breast Symptoms-A-AH, Wound Infection Suspected-A-AH

## 2023-09-30 NOTE — Telephone Encounter (Signed)
 Noted. Patient for an appointment for  10/01/23.

## 2023-10-01 ENCOUNTER — Encounter: Payer: Self-pay | Admitting: Nurse Practitioner

## 2023-10-01 ENCOUNTER — Ambulatory Visit: Admitting: Nurse Practitioner

## 2023-10-01 ENCOUNTER — Other Ambulatory Visit (HOSPITAL_COMMUNITY): Payer: Self-pay

## 2023-10-01 VITALS — BP 108/62 | HR 88 | Temp 97.3°F | Ht 63.0 in | Wt 218.8 lb

## 2023-10-01 DIAGNOSIS — L089 Local infection of the skin and subcutaneous tissue, unspecified: Secondary | ICD-10-CM

## 2023-10-01 MED ORDER — FLUCONAZOLE 150 MG PO TABS
ORAL_TABLET | ORAL | 0 refills | Status: AC
  Filled 2023-10-01: qty 2, 3d supply, fill #0

## 2023-10-01 MED ORDER — CEPHALEXIN 500 MG PO CAPS
500.0000 mg | ORAL_CAPSULE | Freq: Three times a day (TID) | ORAL | 0 refills | Status: AC
  Filled 2023-10-01: qty 30, 10d supply, fill #0

## 2023-10-01 NOTE — Patient Instructions (Signed)
 It was great to see you!  Wash the wound with soap and water twice daily, then cover with neosporin and bandaid  Start keflex  3 time a day   Take diflucan  after finishing antibiotic  Let me know if you get a fever, more drainage, or increased pain   Let's follow-up if symptoms worsen or any concerns   Take care,  Tinnie Harada, NP

## 2023-10-01 NOTE — Progress Notes (Unsigned)
   Acute Office Visit  Subjective:     Patient ID: Becky Gilmore, female    DOB: [birth year], 43 y.o.   MRN: [id number]  Chief Complaint  Patient presents with   Wound Check    Left breast wound infection-had biopsy on 09/04/23    HPI Patient is in today for ***  ROS      Objective:    BP 108/62 (BP Location: Right Arm, Patient Position: Sitting, Cuff Size: Large)   Pulse 88   Temp (!) 97.3 F (36.3 C)   Ht 5' 3 (1.6 m)   Wt 218 lb 12.8 oz (99.2 kg)   LMP 09/29/2023 (Exact Date)   SpO2 98%   BMI 38.76 kg/m  {Vitals History (Optional):23777}  Physical Exam  No results found for any visits on 10/01/23.      Assessment & Plan:   Problem List Items Addressed This Visit   None   No orders of the defined types were placed in this encounter.   No follow-ups on file.  Tinnie DELENA Harada, NP

## 2023-10-02 ENCOUNTER — Other Ambulatory Visit (HOSPITAL_COMMUNITY): Payer: Self-pay

## 2023-10-02 DIAGNOSIS — L089 Local infection of the skin and subcutaneous tissue, unspecified: Secondary | ICD-10-CM | POA: Insufficient documentation

## 2023-10-02 NOTE — Assessment & Plan Note (Signed)
 Breast wound infection post-biopsy presents with redness and slight pain, without fever or significant drainage. The wound is expected to heal from the inside out, with no signs of systemic infection. She has a history of yeast infections with antibiotics. Prescribe Keflex  500mg  three times daily for ten days, with or without food. Advise washing the wound with soap and water twice daily. Instruct to cover the wound with Neosporin and a Band-Aid until healed. Prescribe Diflucan  to prevent yeast infection, take one tablet post-antibiotics and a second tablet three days later if needed. Instruct to monitor for fever, increased pain, or pus, and to call if these occur. Refer back to the breast center if symptoms worsen.

## 2023-11-17 ENCOUNTER — Other Ambulatory Visit (HOSPITAL_COMMUNITY): Payer: Self-pay

## 2023-11-17 ENCOUNTER — Other Ambulatory Visit: Payer: Self-pay | Admitting: Nurse Practitioner

## 2023-11-17 DIAGNOSIS — G43709 Chronic migraine without aura, not intractable, without status migrainosus: Secondary | ICD-10-CM

## 2023-11-17 MED ORDER — SUMATRIPTAN SUCCINATE 25 MG PO TABS
25.0000 mg | ORAL_TABLET | Freq: Every day | ORAL | 0 refills | Status: AC | PRN
Start: 2023-11-17 — End: ?
  Filled 2023-11-17: qty 9, 30d supply, fill #0

## 2023-11-17 NOTE — Telephone Encounter (Signed)
 Requesting:  SUMAtriptan  (IMITREX ) 25 MG tablet Last Visit: 10/01/2023 Next Visit: 07/19/2024 Last Refill: 04/29/2022  Please Advise

## 2023-12-26 ENCOUNTER — Ambulatory Visit
Admission: RE | Admit: 2023-12-26 | Discharge: 2023-12-26 | Disposition: A | Discharge status: Home / Self Care | Source: Ambulatory Visit | Attending: Internal Medicine | Admitting: Internal Medicine

## 2023-12-26 ENCOUNTER — Telehealth: Payer: Self-pay | Admitting: Emergency Medicine

## 2023-12-26 ENCOUNTER — Other Ambulatory Visit: Payer: Self-pay

## 2023-12-26 ENCOUNTER — Ambulatory Visit
Admission: RE | Admit: 2023-12-26 | Discharge: 2023-12-26 | Disposition: A | Discharge status: Home / Self Care | Source: Ambulatory Visit

## 2023-12-26 ENCOUNTER — Encounter: Payer: Self-pay | Admitting: Emergency Medicine

## 2023-12-26 DIAGNOSIS — R350 Frequency of micturition: Secondary | ICD-10-CM

## 2023-12-26 DIAGNOSIS — M545 Low back pain, unspecified: Secondary | ICD-10-CM | POA: Diagnosis not present

## 2023-12-26 LAB — POCT URINE DIPSTICK
Bilirubin, UA: NEGATIVE
Blood, UA: NEGATIVE
Glucose, UA: NEGATIVE mg/dL
Ketones, POC UA: NEGATIVE mg/dL
Leukocytes, UA: NEGATIVE
Nitrite, UA: NEGATIVE
Spec Grav, UA: 1.02 (ref 1.010–1.025)
Urobilinogen, UA: 1 U/dL
pH, UA: 7.5 (ref 5.0–8.0)

## 2023-12-26 LAB — POCT URINE PREGNANCY: Preg Test, Ur: NEGATIVE

## 2023-12-26 MED ORDER — IBUPROFEN 800 MG PO TABS
800.0000 mg | ORAL_TABLET | Freq: Three times a day (TID) | ORAL | 0 refills | Status: AC
  Filled 2023-12-26: qty 21, 7d supply, fill #0

## 2023-12-26 MED ORDER — IBUPROFEN 800 MG PO TABS: 800.0000 mg | ORAL_TABLET | Freq: Three times a day (TID) | ORAL | 0 refills | Status: DC

## 2023-12-26 MED ORDER — CYCLOBENZAPRINE HCL 5 MG PO TABS
5.0000 mg | ORAL_TABLET | Freq: Three times a day (TID) | ORAL | 0 refills | Status: AC | PRN
  Filled 2023-12-26: qty 30, 10d supply, fill #0

## 2023-12-26 MED ORDER — CYCLOBENZAPRINE HCL 5 MG PO TABS: 5.0000 mg | ORAL_TABLET | Freq: Three times a day (TID) | ORAL | 0 refills | Status: DC | PRN

## 2023-12-26 NOTE — Telephone Encounter (Signed)
Sent to different pharmacy per pt request

## 2023-12-26 NOTE — ED Provider Notes (Signed)
 [name]    CSN: [id number] Arrival date & time: 12/26/23  1344      History   Chief Complaint Chief Complaint  Patient presents with   Back Pain    HPI Becky Gilmore is a 43 y.o. female.   43 year old female who presents urgent care with lower back pain and urinary frequency.  She reports that this started this morning.  She reports that she is going to the restroom multiple times.  She denies any dysuria, hematuria, vaginal discharge, vaginal pain.  The lower back pain is positional and worse with lying flat or standing.  It does not get worse with lifting the legs up.  She denies any bowel or bladder incontinence   Back Pain Associated symptoms: no abdominal pain, no chest pain, no dysuria and no fever     Past Medical History:  Diagnosis Date   Abnormal Pap smear of cervix 01/2018   cytology negative, HPV positive, repeat pap in 1 year   Migraine    Vitamin D  deficiency     Patient Active Problem List   Diagnosis Date Noted   Skin infection 10/02/2023   Prediabetes 04/29/2022   Routine general medical examination at a health care facility 04/29/2022   Abnormal Papanicolaou smear of cervix with positive human papilloma virus (HPV) test 04/17/2021   Chronic migraine without aura without status migrainosus, not intractable 04/10/2021   Hyperlipidemia 07/26/2020   Morbid obesity (HCC) 07/26/2020   Vitamin D  deficiency 07/25/2020   SOB (shortness of breath) on exertion 07/25/2020    Past Surgical History:  Procedure Laterality Date   BREAST BIOPSY Left 09/04/2023   US  LT BREAST BX W LOC DEV 1ST LESION IMG BX SPEC US  GUIDE 09/04/2023 [facility]   CESAREAN SECTION     COLPOSCOPY  2021   genital warts      OB History     Gravida  1   Para  1   Term      Preterm      AB      Living  1      SAB      IAB      Ectopic      Multiple      Live Births               Home Medications    Prior to Admission  medications   Medication Sig Start Date End Date Taking? Authorizing Provider  cephALEXin  (KEFLEX ) 500 MG capsule Take 1 capsule (500 mg total) by mouth 3 (three) times daily. 10/01/23   McElwee, Lauren A, NP  cholecalciferol (VITAMIN D3) 25 MCG (1000 UNIT) tablet Take 2,000 Units by mouth daily. Patient not taking: Reported on 10/01/2023    [provider]  fluconazole  (DIFLUCAN ) 150 MG tablet Take 1 tablet after finishing antibiotic and a second tablet 3 days later if needed 10/01/23   McElwee, Lauren A, NP  SUMAtriptan  (IMITREX ) 25 MG tablet Take 1 tablet (25 mg total) by mouth daily as needed at the start of the headache. May repeat in 2 hours x 1 if headache persists. Max of 2 tabs/24 hours. 11/17/23   McElwee, Tinnie LABOR, NP    Family History Family History  Problem Relation Age of Onset   Diabetes Mother    Breast cancer Neg Hx     Social History Social History   Tobacco Use   Smoking status: Never   Smokeless tobacco: Never  Vaping Use  Vaping status: Never Used  Substance Use Topics   Alcohol use: Yes    Alcohol/week: 4.0 standard drinks of alcohol    Types: 4 Glasses of wine per week   Drug use: Never     Allergies   Patient has no known allergies.   Review of Systems Review of Systems  Constitutional:  Negative for chills and fever.  HENT:  Negative for ear pain and sore throat.   Eyes:  Negative for pain and visual disturbance.  Respiratory:  Negative for cough and shortness of breath.   Cardiovascular:  Negative for chest pain and palpitations.  Gastrointestinal:  Negative for abdominal pain and vomiting.  Genitourinary:  Positive for frequency. Negative for dysuria and hematuria.  Musculoskeletal:  Positive for back pain. Negative for arthralgias.  Skin:  Negative for color change and rash.  Neurological:  Negative for seizures and syncope.  All other systems reviewed and are negative.    Physical Exam Triage Vital Signs ED Triage Vitals  [12/26/23 1434]  Encounter Vitals Group     BP 121/81     Girls Systolic BP Percentile      Girls Diastolic BP Percentile      Boys Systolic BP Percentile      Boys Diastolic BP Percentile      Pulse Rate 95     Resp 18     Temp 98.4 F (36.9 C)     Temp Source Oral     SpO2 96 %     Weight      Height      Head Circumference      Peak Flow      Pain Score 7     Pain Loc      Pain Education      Exclude from Growth Chart    No data found.  Updated Vital Signs BP 121/81 (BP Location: Left Arm)   Pulse 95   Temp 98.4 F (36.9 C) (Oral)   Resp 18   SpO2 96%   Visual Acuity Right Eye Distance:   Left Eye Distance:   Bilateral Distance:    Right Eye Near:   Left Eye Near:    Bilateral Near:     Physical Exam Vitals and nursing note reviewed.  Constitutional:      General: She is not in acute distress.    Appearance: She is well-developed.  HENT:     Head: Normocephalic and atraumatic.  Eyes:     Conjunctiva/sclera: Conjunctivae normal.  Cardiovascular:     Rate and Rhythm: Normal rate and regular rhythm.     Heart sounds: No murmur heard. Pulmonary:     Effort: Pulmonary effort is normal. No respiratory distress.     Breath sounds: Normal breath sounds.  Abdominal:     General: Bowel sounds are normal.     Palpations: Abdomen is soft.     Tenderness: There is no abdominal tenderness.  Musculoskeletal:        General: No swelling.     Cervical back: Neck supple.     Lumbar back: Spasms and tenderness present. No deformity. Normal range of motion. Negative right straight leg raise test and negative left straight leg raise test.     Comments: Low back pain with movement  Skin:    General: Skin is warm and dry.     Capillary Refill: Capillary refill takes less than 2 seconds.  Neurological:     Mental Status: She is alert.  Psychiatric:  Mood and Affect: Mood normal.      UC Treatments / Results  Labs (all labs ordered are listed, but only  abnormal results are displayed) Labs Reviewed  POCT URINE PREGNANCY - Normal  POCT URINE DIPSTICK - Normal    EKG   Radiology No results found.  Procedures Procedures (including critical care time)  Medications Ordered in UC Medications - No data to display  Initial Impression / Assessment and Plan / UC Course  I have reviewed the triage vital signs and the nursing notes.  Pertinent labs & imaging results that were available during my care of the patient were reviewed by me and considered in my medical decision making (see chart for details).     Acute bilateral low back pain without sciatica  Urinary frequency   Urinalysis done today did not show any abnormalities.  Given the symptoms we will send the urine off for culture and if this grows bacteria we will contact you and start antibiotics.  The lower back pain does seem to be more muscular in nature.  We will treat this with a muscle relaxer along with an anti-inflammatory.  We will treat with the following: Flexeril  5 mg every 8 hours as needed for muscle spasms.  Use caution as this medication can cause drowsiness.  Ibuprofen  800 mg every 8 hours as needed for pain Light stretching to improve mobility. May alternate heat and ice to help with symptoms.   May use moist heat to help with symptoms Make sure to stay hydrated by drinking plenty of water.  Return to urgent care or PCP if symptoms worsen or fail to resolve.    Final Clinical Impressions(s) / UC Diagnoses   Final diagnoses:  Acute bilateral low back pain without sciatica  Urinary frequency     Discharge Instructions      Urinalysis done today did not show any abnormalities.  Given the symptoms we will send the urine off for culture and if this grows bacteria we will contact you and start antibiotics.  The lower back pain does seem to be more muscular in nature.  We will treat this with a muscle relaxer along with an anti-inflammatory.  We will treat with  the following: Flexeril  5 mg every 8 hours as needed for muscle spasms.  Use caution as this medication can cause drowsiness.  Ibuprofen  800 mg every 8 hours as needed for pain Light stretching to improve mobility. May alternate heat and ice to help with symptoms.   May use moist heat to help with symptoms Make sure to stay hydrated by drinking plenty of water.  Return to urgent care or PCP if symptoms worsen or fail to resolve.      ED Prescriptions   None    PDMP not reviewed this encounter.   Teresa Almarie LABOR, PA-C 12/26/23 1504

## 2023-12-26 NOTE — Discharge Instructions (Addendum)
 Urinalysis done today did not show any abnormalities.  Given the symptoms we will send the urine off for culture and if this grows bacteria we will contact you and start antibiotics.  The lower back pain does seem to be more muscular in nature.  We will treat this with a muscle relaxer along with an anti-inflammatory.  We will treat with the following: Flexeril  5 mg every 8 hours as needed for muscle spasms.  Use caution as this medication can cause drowsiness.  Ibuprofen  800 mg every 8 hours as needed for pain Light stretching to improve mobility. May alternate heat and ice to help with symptoms.   May use moist heat to help with symptoms Make sure to stay hydrated by drinking plenty of water.  Return to urgent care or PCP if symptoms worsen or fail to resolve.

## 2023-12-26 NOTE — ED Triage Notes (Signed)
 Pt sts some lower back pain upon waking today; pt sts also noted some urinary frequency; denies injury

## 2023-12-28 ENCOUNTER — Other Ambulatory Visit (HOSPITAL_COMMUNITY): Payer: Self-pay

## 2024-07-19 ENCOUNTER — Encounter: Admitting: Nurse Practitioner
